# Patient Record
Sex: Female | Born: 1990 | Race: Black or African American | Hispanic: No | Marital: Single | Smoking: Former smoker
Health system: Southern US, Community
[De-identification: ages and names within clinical notes are randomized; demographics above are authoritative.]

## PROBLEM LIST (undated history)

## (undated) DIAGNOSIS — D259 Leiomyoma of uterus, unspecified: Secondary | ICD-10-CM

## (undated) DIAGNOSIS — G43909 Migraine, unspecified, not intractable, without status migrainosus: Secondary | ICD-10-CM

## (undated) DIAGNOSIS — D649 Anemia, unspecified: Secondary | ICD-10-CM

## (undated) DIAGNOSIS — J45909 Unspecified asthma, uncomplicated: Secondary | ICD-10-CM

## (undated) HISTORY — DX: Migraine, unspecified, not intractable, without status migrainosus: G43.909

## (undated) HISTORY — DX: Leiomyoma of uterus, unspecified: D25.9

## (undated) HISTORY — PX: TONSILECTOMY, ADENOIDECTOMY, BILATERAL MYRINGOTOMY AND TUBES: SHX2538

---

## 2013-10-24 ENCOUNTER — Emergency Department (INDEPENDENT_AMBULATORY_CARE_PROVIDER_SITE_OTHER)
Admission: EM | Admit: 2013-10-24 | Discharge: 2013-10-24 | Disposition: A | Discharge status: Home / Self Care | Source: Home / Self Care

## 2013-10-24 ENCOUNTER — Encounter (HOSPITAL_COMMUNITY): Payer: Self-pay | Admitting: Emergency Medicine

## 2013-10-24 ENCOUNTER — Other Ambulatory Visit (HOSPITAL_COMMUNITY)
Admission: RE | Admit: 2013-10-24 | Discharge: 2013-10-24 | Disposition: A | Discharge status: Home / Self Care | Source: Ambulatory Visit | Attending: Family Medicine | Admitting: Family Medicine

## 2013-10-24 DIAGNOSIS — L259 Unspecified contact dermatitis, unspecified cause: Secondary | ICD-10-CM

## 2013-10-24 DIAGNOSIS — N949 Unspecified condition associated with female genital organs and menstrual cycle: Secondary | ICD-10-CM

## 2013-10-24 DIAGNOSIS — N898 Other specified noninflammatory disorders of vagina: Secondary | ICD-10-CM

## 2013-10-24 DIAGNOSIS — R102 Pelvic and perineal pain: Secondary | ICD-10-CM

## 2013-10-24 DIAGNOSIS — Z113 Encounter for screening for infections with a predominantly sexual mode of transmission: Secondary | ICD-10-CM | POA: Insufficient documentation

## 2013-10-24 DIAGNOSIS — N760 Acute vaginitis: Secondary | ICD-10-CM | POA: Insufficient documentation

## 2013-10-24 DIAGNOSIS — N730 Acute parametritis and pelvic cellulitis: Secondary | ICD-10-CM

## 2013-10-24 HISTORY — DX: Unspecified asthma, uncomplicated: J45.909

## 2013-10-24 LAB — POCT URINALYSIS DIP (DEVICE)
Bilirubin Urine: NEGATIVE
GLUCOSE, UA: NEGATIVE mg/dL
Hgb urine dipstick: NEGATIVE
Ketones, ur: NEGATIVE mg/dL
LEUKOCYTES UA: NEGATIVE
Nitrite: NEGATIVE
Protein, ur: NEGATIVE mg/dL
Specific Gravity, Urine: >=1.03 (ref 1.005–1.030)
UROBILINOGEN UA: 1 mg/dL (ref 0.0–1.0)
pH: 5.5 (ref 5.0–8.0)

## 2013-10-24 LAB — POCT PREGNANCY, URINE: PREG TEST UR: NEGATIVE

## 2013-10-24 MED ORDER — METRONIDAZOLE 500 MG PO TABS: 500.0000 mg | ORAL_TABLET | Freq: Two times a day (BID) | ORAL | Status: DC

## 2013-10-24 MED ORDER — AZITHROMYCIN 250 MG PO TABS
1000.0000 mg | ORAL_TABLET | Freq: Every day | ORAL | Status: DC
  Administered 2013-10-24: 1000 mg via ORAL

## 2013-10-24 MED ORDER — AZITHROMYCIN 250 MG PO TABS
ORAL_TABLET | ORAL | Status: AC
  Filled 2013-10-24: qty 4

## 2013-10-24 MED ORDER — AZITHROMYCIN 250 MG PO TABS: 1000.0000 mg | ORAL_TABLET | Freq: Once | ORAL | Status: DC

## 2013-10-24 MED ORDER — LIDOCAINE HCL (PF) 1 % IJ SOLN
INTRAMUSCULAR | Status: AC
  Filled 2013-10-24: qty 5

## 2013-10-24 MED ORDER — CEFTRIAXONE SODIUM 250 MG IJ SOLR
INTRAMUSCULAR | Status: AC
  Filled 2013-10-24: qty 250

## 2013-10-24 MED ORDER — CEFTRIAXONE SODIUM 250 MG IJ SOLR
250.0000 mg | Freq: Once | INTRAMUSCULAR | Status: AC
  Administered 2013-10-24: 250 mg via INTRAMUSCULAR

## 2013-10-24 MED ORDER — TRIAMCINOLONE ACETONIDE 0.1 % EX CREA: TOPICAL_CREAM | CUTANEOUS | Status: DC

## 2013-10-24 NOTE — ED Notes (Addendum)
2 days of swelling and rash of right pinky finger.  Pt denies injury or exposure to allergens Also pt c/o  2 weeks of urinary frequency and dysuria

## 2013-10-24 NOTE — ED Provider Notes (Signed)
CSN: [id number]     Arrival date & time 10/24/13  1106 History   First MD Initiated Contact with Patient 10/24/13 1328     Chief Complaint  Patient presents with  . Rash   (Consider location/radiation/quality/duration/timing/severity/associated sxs/prior Treatment) HPI Comments: 23 year old female presents with a itchy rash to the right fifth digit. Occurred several days ago and is getting worse. She denies injury or pain. There are no other areas of the rash.  Second complaint is that of dysuria for 2 weeks. She feels a heaviness across the pelvis. After voiding she continues to feel the discomfort in the suprapubic area and pelvis.  Patient is a 23 y.o. female presenting with rash.  Rash   Past Medical History  Diagnosis Date  . Asthma    History reviewed. No pertinent past surgical history. No family history on file. History  Substance Use Topics  . Smoking status: Current Every Day Smoker    Types: Cigarettes  . Smokeless tobacco: Not on file  . Alcohol Use: No   OB History   Grav Para Term Preterm Abortions TAB SAB Ect Mult Living                 Review of Systems  Constitutional: Negative.   HENT: Negative.   Respiratory: Negative.   Gastrointestinal: Negative.   Genitourinary: Positive for dysuria, frequency and pelvic pain. Negative for vaginal discharge.  Musculoskeletal: Negative.   Skin: Positive for rash.       Papular rash to the right fifth digit. No erythema or signs of infection.    Allergies  Review of patient's allergies indicates no known allergies.  Home Medications   Current Outpatient Rx  Name  Route  Sig  Dispense  Refill  . metroNIDAZOLE (FLAGYL) 500 MG tablet   Oral   Take 1 tablet (500 mg total) by mouth 2 (two) times daily. X 7 days   14 tablet   0   . triamcinolone cream (KENALOG) 0.1 %      Apply to finger bid prn rash and itching.   15 g   0    BP 110/61  Pulse 59  Temp(Src) 98.5 F (36.9 C) (Oral)  Resp 16  SpO2 100%   LMP 09/26/2013 Physical Exam  Nursing note and vitals reviewed. Constitutional: She is oriented to person, place, and time. She appears well-developed and well-nourished. No distress.  Neck: Normal range of motion. Neck supple.  Cardiovascular: Normal rate, regular rhythm and normal heart sounds.   Pulmonary/Chest: Effort normal and breath sounds normal.  Abdominal: Soft. Bowel sounds are normal. She exhibits no distension.  Tenderness across the pelvis with greatest over the suprapubis.  Genitourinary:  Normal external female genitalia Small vaginal opening Moderate amount of thick white creamy discharge coating the proximal vaginal walls, vaginal vault and the cervix. Cervix is right of the midline and anterior. Os is closed. Cervix is pink without lesions. Bimanual positive CMT and right adnexal tenderness.  Neurological: She is alert and oriented to person, place, and time. She exhibits normal muscle tone.  Skin: Skin is warm and dry.  Psychiatric: She has a normal mood and affect.    ED Course  Procedures (including critical care time) Labs Review Labs Reviewed  POCT URINALYSIS DIP (DEVICE)  POCT PREGNANCY, URINE  CERVICOVAGINAL ANCILLARY ONLY   Imaging Review No results found. Results for orders placed during the hospital encounter of 10/24/13  POCT URINALYSIS DIP (DEVICE)      Result Value Range  Glucose, UA NEGATIVE  NEGATIVE mg/dL   Bilirubin Urine NEGATIVE  NEGATIVE   Ketones, ur NEGATIVE  NEGATIVE mg/dL   Specific Gravity, Urine >=1.030  1.005 - 1.030   Hgb urine dipstick NEGATIVE  NEGATIVE   pH 5.5  5.0 - 8.0   Protein, ur NEGATIVE  NEGATIVE mg/dL   Urobilinogen, UA 1.0  0.0 - 1.0 mg/dL   Nitrite NEGATIVE  NEGATIVE   Leukocytes, UA NEGATIVE  NEGATIVE  POCT PREGNANCY, URINE      Result Value Range   Preg Test, Ur NEGATIVE  NEGATIVE      MDM   1. Contact dermatitis   2. PID (acute pelvic inflammatory disease)   3. Vaginal discharge   4. Pelvic pain       Flagyl 500 mg twice a day for 7 days Rx Rocephin 250 mg IM and azithromycin 1 g by mouth Triamcinolone cream apply finger twice a day  Hayden Rasmussen, NP 10/24/13 1450

## 2013-10-24 NOTE — Discharge Instructions (Signed)
Contact Dermatitis °Contact dermatitis is a reaction to certain substances that touch the skin. Contact dermatitis can be either irritant contact dermatitis or allergic contact dermatitis. Irritant contact dermatitis does not require previous exposure to the substance for a reaction to occur. Allergic contact dermatitis only occurs if you have been exposed to the substance before. Upon a repeat exposure, your body reacts to the substance.  °CAUSES  °Many substances can cause contact dermatitis. Irritant dermatitis is most commonly caused by repeated exposure to mildly irritating substances, such as: °· Makeup. °· Soaps. °· Detergents. °· Bleaches. °· Acids. °· Metal salts, such as nickel. °Allergic contact dermatitis is most commonly caused by exposure to: °· Poisonous plants. °· Chemicals (deodorants, shampoos). °· Jewelry. °· Latex. °· Neomycin in triple antibiotic cream. °· Preservatives in products, including clothing. °SYMPTOMS  °The area of skin that is exposed may develop: °· Dryness or flaking. °· Redness. °· Cracks. °· Itching. °· Pain or a burning sensation. °· Blisters. °With allergic contact dermatitis, there may also be swelling in areas such as the eyelids, mouth, or genitals.  °DIAGNOSIS  °Your caregiver can usually tell what the problem is by doing a physical exam. In cases where the cause is uncertain and an allergic contact dermatitis is suspected, a patch skin test may be performed to help determine the cause of your dermatitis. °TREATMENT °Treatment includes protecting the skin from further contact with the irritating substance by avoiding that substance if possible. Barrier creams, powders, and gloves may be helpful. Your caregiver may also recommend: °· Steroid creams or ointments applied 2 times daily. For best results, soak the rash area in cool water for 20 minutes. Then apply the medicine. Cover the area with a plastic wrap. You can store the steroid cream in the refrigerator for a "chilly"  effect on your rash. That may decrease itching. Oral steroid medicines may be needed in more severe cases. °· Antibiotics or antibacterial ointments if a skin infection is present. °· Antihistamine lotion or an antihistamine taken by mouth to ease itching. °· Lubricants to keep moisture in your skin. °· Burow's solution to reduce redness and soreness or to dry a weeping rash. Mix one packet or tablet of solution in 2 cups cool water. Dip a clean washcloth in the mixture, wring it out a bit, and put it on the affected area. Leave the cloth in place for 30 minutes. Do this as often as possible throughout the day. °· Taking several cornstarch or baking soda baths daily if the area is too large to cover with a washcloth. °Harsh chemicals, such as alkalis or acids, can cause skin damage that is like a burn. You should flush your skin for 15 to 20 minutes with cold water after such an exposure. You should also seek immediate medical care after exposure. Bandages (dressings), antibiotics, and pain medicine may be needed for severely irritated skin.  °HOME CARE INSTRUCTIONS °· Avoid the substance that caused your reaction. °· Keep the area of skin that is affected away from hot water, soap, sunlight, chemicals, acidic substances, or anything else that would irritate your skin. °· Do not scratch the rash. Scratching may cause the rash to become infected. °· You may take cool baths to help stop the itching. °· Only take over-the-counter or prescription medicines as directed by your caregiver. °· See your caregiver for follow-up care as directed to make sure your skin is healing properly. °SEEK MEDICAL CARE IF:  °· Your condition is not better after 3   days of treatment.  You seem to be getting worse.  You see signs of infection such as swelling, tenderness, redness, soreness, or warmth in the affected area.  You have any problems related to your medicines. Document Released: 10/05/2000 Document Revised: 12/31/2011  Document Reviewed: 03/13/2011 [facility] Patient Information 2014 [place], [place].  Pelvic Inflammatory Disease Pelvic inflammatory disease (PID) is an infection in some or all of the female organs. PID can be in the uterus, ovaries, fallopian tubes, or the surrounding tissues inside the lower belly area (pelvis). HOME CARE   If given, take your antibiotic medicine as told. Finish them even if you start to feel better.  Only take medicine as told by your doctor.  Do not have sex (intercourse) until treatment is done or as told by your doctor.  Tell your sex partner if you have PID. Your partner may need to be treated.  Keep all doctor visits. GET HELP RIGHT AWAY IF:   You have a fever.  You have more belly (abdominal) or lower belly pain.  You have chills.  You have pain when you pee (urinate).  You are not better after 72 hours.  You have more fluid (discharge) coming from your vagina or fluid that is not normal.  You need pain medicine from your doctor.  You throw up (vomit).  You cannot take your medicines.  Your partner has a sexually transmitted disease (STD). MAKE SURE YOU:   Understand these instructions.  Will watch your condition.  Will get help right away if you are not doing well or get worse. Document Released: 01/04/2009 Document Revised: 02/02/2013 Document Reviewed: 10/04/2011 [facility] Patient Information 2014 [place]Care, [place].  Pelvic Pain Pelvic pain is pain felt below the belly button and between your hips. It can be caused by many different things. It is important to get help right away. This is especially true for severe, sharp, or unusual pain that comes on suddenly.  HOME CARE  Only take medicine as told by your doctor.  Rest as told by your doctor.  Eat a healthy diet, such as fruits, vegetables, and lean meats.  Drink enough fluids to keep your pee (urine) clear or pale yellow, or as told.  Avoid sex (intercourse) if it causes  pain.  Apply warm or cold packs to your lower belly (abdomen). Use the type of pack that helps the pain.  Avoid situations that cause you stress.  Keep a journal to track your pain. Write down:  When the pain started.  Where it is located.  If there are things that seem to be related to the pain, such as food or your period.  Follow up with your doctor as told. GET HELP RIGHT AWAY IF:   You have heavy bleeding from the vagina.  You have more pelvic pain.  You feel lightheaded or pass out (faint).  You have chills.  You have pain when you pee or have blood in your pee.  You cannot stop having watery poop (diarrhea).  You cannot stop throwing up (vomiting).  You have a fever or lasting symptoms for more than 3 days.  You have a fever and your symptoms suddenly get worse.  You are being physically or sexually abused.  Your medicine does not help your pain.  You have fluid (discharge) coming from your vagina that is not normal. MAKE SURE YOU:  Understand these instructions.  Will watch your condition.  Will get help if you are not doing well or get worse.  Document Released: 03/26/2008 Document Revised: 04/08/2012 Document Reviewed: 01/28/2012 [facility] Patient Information 2014 [place], [place].

## 2013-10-26 ENCOUNTER — Emergency Department (HOSPITAL_COMMUNITY)

## 2013-10-26 ENCOUNTER — Encounter (HOSPITAL_COMMUNITY): Payer: Self-pay | Admitting: Emergency Medicine

## 2013-10-26 ENCOUNTER — Emergency Department (HOSPITAL_COMMUNITY)
Admission: EM | Admit: 2013-10-26 | Discharge: 2013-10-26 | Disposition: A | Discharge status: Home / Self Care | Attending: Emergency Medicine | Admitting: Emergency Medicine

## 2013-10-26 DIAGNOSIS — R21 Rash and other nonspecific skin eruption: Secondary | ICD-10-CM | POA: Insufficient documentation

## 2013-10-26 DIAGNOSIS — F17200 Nicotine dependence, unspecified, uncomplicated: Secondary | ICD-10-CM | POA: Insufficient documentation

## 2013-10-26 DIAGNOSIS — J45909 Unspecified asthma, uncomplicated: Secondary | ICD-10-CM | POA: Insufficient documentation

## 2013-10-26 MED ORDER — HYDROCORTISONE 1 % EX CREA: TOPICAL_CREAM | CUTANEOUS | Status: DC

## 2013-10-26 NOTE — ED Notes (Signed)
Pt in c/o rash and redness to right pinky finger since Thursday, seen at urgent care for same yesterday but states symptoms are worse

## 2013-10-26 NOTE — Discharge Instructions (Signed)

## 2013-10-26 NOTE — ED Provider Notes (Signed)
Medical screening examination/treatment/procedure(s) were performed by non-physician practitioner and as supervising physician I was immediately available for consultation/collaboration.  EKG Interpretation   None         Richardean Canalavid H Noeh Sparacino, MD 10/26/13 [phone]

## 2013-10-26 NOTE — ED Provider Notes (Signed)
CSN: [id number]     Arrival date & time 10/26/13  0229 History   First MD Initiated Contact with Patient 10/26/13 [phone]     Chief Complaint  Patient presents with  . Rash   (Consider location/radiation/quality/duration/timing/severity/associated sxs/prior Treatment) HPI Comments: Patient presents emergency department with chief complaint of itchy and painful rash and irritation to the right small finger. She states that she first noticed the symptoms on Thursday. She was seen at urgent care yesterday, and was prescribed triamcinolone, which she has been using 3 times daily, with no relief. She denies fevers or chills. Denies rash anywhere else. She states the finger is painful to palpation and with movement.  Patient is a 23 y.o. female presenting with rash. The history is provided by the patient and a significant other. No language interpreter was used.  Rash   Past Medical History  Diagnosis Date  . Asthma    History reviewed. No pertinent past surgical history. History reviewed. No pertinent family history. History  Substance Use Topics  . Smoking status: Current Every Day Smoker    Types: Cigarettes  . Smokeless tobacco: Not on file  . Alcohol Use: No   OB History   Grav Para Term Preterm Abortions TAB SAB Ect Mult Living                 Review of Systems  Skin: Positive for rash.  All other systems reviewed and are negative.    Allergies  Review of patient's allergies indicates no known allergies.  Home Medications   Current Outpatient Rx  Name  Route  Sig  Dispense  Refill  . metroNIDAZOLE (FLAGYL) 500 MG tablet   Oral   Take 1 tablet (500 mg total) by mouth 2 (two) times daily. X 7 days   14 tablet   0   . triamcinolone cream (KENALOG) 0.1 %      Apply to finger bid prn rash and itching.   15 g   0    BP 118/60  Pulse 62  Temp(Src) 98.3 F (36.8 C) (Oral)  Resp 16  SpO2 99%  LMP 09/26/2013 Physical Exam  Nursing note and vitals  reviewed. Constitutional: She is oriented to person, place, and time. She appears well-developed and well-nourished.  HENT:  Head: Normocephalic and atraumatic.  Eyes: Conjunctivae and EOM are normal.  Neck: Normal range of motion.  Cardiovascular: Normal rate.   Pulmonary/Chest: Effort normal.  Abdominal: She exhibits no distension.  Musculoskeletal: Normal range of motion.  Neurological: She is alert and oriented to person, place, and time.  Skin: Skin is dry.  Maculopapular rash to the right small finger, no evidence of paronychia or felon  Psychiatric: She has a normal mood and affect. Her behavior is normal. Judgment and thought content normal.    ED Course  Procedures (including critical care time) Labs Review Labs Reviewed - No data to display Imaging Review No results found.  EKG Interpretation   None       MDM   1. Rash    Rash appears to be contact dermatitis.  No evidence of underlying or acute infections.  Will discharge to home with dermatology followup.  Recommend hydrocortisone cream.  Patient is stable and ready for discharge.    Roxy Horsemanobert Theresa Wedel, PA-C 10/26/13 [phone]

## 2013-10-27 NOTE — ED Notes (Signed)
GC/Chlamydia neg., Affirm: Candida and Trich neg., Gardnerella pos. Pt. adequately treated with Flagyl. Liat Mayol M 10/27/2013  

## 2013-10-27 NOTE — ED Provider Notes (Signed)
Medical screening examination/treatment/procedure(s) were performed by resident physician or non-physician practitioner and as supervising physician I was immediately available for consultation/collaboration.   Langston Summerfield DOUGLAS MD.   Lawerence Dery D Kathrynne Kulinski, MD 10/27/13 0843 

## 2013-11-10 ENCOUNTER — Encounter (HOSPITAL_COMMUNITY): Payer: Self-pay | Admitting: Emergency Medicine

## 2013-11-10 ENCOUNTER — Emergency Department (INDEPENDENT_AMBULATORY_CARE_PROVIDER_SITE_OTHER)
Admission: EM | Admit: 2013-11-10 | Discharge: 2013-11-10 | Disposition: A | Discharge status: Home / Self Care | Source: Home / Self Care | Attending: Family Medicine | Admitting: Family Medicine

## 2013-11-10 DIAGNOSIS — R102 Pelvic and perineal pain: Secondary | ICD-10-CM

## 2013-11-10 DIAGNOSIS — N949 Unspecified condition associated with female genital organs and menstrual cycle: Secondary | ICD-10-CM

## 2013-11-10 LAB — COMPREHENSIVE METABOLIC PANEL
ALK PHOS: 45 U/L (ref 39–117)
ALT: 16 U/L (ref 0–35)
AST: 21 U/L (ref 0–37)
Albumin: 3.7 g/dL (ref 3.5–5.2)
BILIRUBIN TOTAL: 0.3 mg/dL (ref 0.3–1.2)
BUN: 8 mg/dL (ref 6–23)
CHLORIDE: 107 meq/L (ref 96–112)
CO2: 25 meq/L (ref 19–32)
Calcium: 9 mg/dL (ref 8.4–10.5)
Creatinine, Ser: 0.85 mg/dL (ref 0.50–1.10)
GLUCOSE: 94 mg/dL (ref 70–99)
POTASSIUM: 4.4 meq/L (ref 3.7–5.3)
SODIUM: 142 meq/L (ref 137–147)
Total Protein: 7.1 g/dL (ref 6.0–8.3)

## 2013-11-10 LAB — CBC
HEMATOCRIT: 36.8 % (ref 36.0–46.0)
Hemoglobin: 12.7 g/dL (ref 12.0–15.0)
MCH: 31.3 pg (ref 26.0–34.0)
MCHC: 34.5 g/dL (ref 30.0–36.0)
MCV: 90.6 fL (ref 78.0–100.0)
PLATELETS: 204 10*3/uL (ref 150–400)
RBC: 4.06 MIL/uL (ref 3.87–5.11)
RDW: 13 % (ref 11.5–15.5)
WBC: 3.8 10*3/uL — ABNORMAL LOW (ref 4.0–10.5)

## 2013-11-10 LAB — POCT URINALYSIS DIP (DEVICE)
BILIRUBIN URINE: NEGATIVE
GLUCOSE, UA: NEGATIVE mg/dL
HGB URINE DIPSTICK: NEGATIVE
Ketones, ur: NEGATIVE mg/dL
Leukocytes, UA: NEGATIVE
Nitrite: NEGATIVE
PH: 7 (ref 5.0–8.0)
Protein, ur: NEGATIVE mg/dL
Specific Gravity, Urine: 1.025 (ref 1.005–1.030)
Urobilinogen, UA: 0.2 mg/dL (ref 0.0–1.0)

## 2013-11-10 LAB — POCT PREGNANCY, URINE: Preg Test, Ur: NEGATIVE

## 2013-11-10 LAB — LIPASE, BLOOD: LIPASE: 59 U/L (ref 11–59)

## 2013-11-10 NOTE — Discharge Instructions (Signed)
Thank you for coming in today. Take up to 2 Aleve twice daily for pain. You may also take this with Tylenol.  Make an appointment with an OB/GYN Dr. ASAP If you get dramatically worse go to [facility] hospital emergency room. If your belly pain worsens, or you have high fever, bad vomiting, blood in your stool or black tarry stool go to the Emergency Room.  I will call you if your lab tests are very abnormal. Try taking MiraLAX daily to have a soft bowel movement daily.  [facility] [address] [place], [place] [phone]  [facility] [address] [place], [place] [phone]  [facility]: Silverio Lay MD [address] [place], [place] [phone]  [facility] [address] [place], [place] [phone]  [facility]: Marcelle Overlie MD [address] [place], [place] [phone]  [facility]: Bing Plume MD [address] [place], [place] [phone]  [facility] [address] [place], [place] [phone]   [facility]: [address] [place], [place] [address] [phone]   Abdominal Pain, Women Abdominal (stomach, pelvic, or belly) pain can be caused by many things. It is important to tell your doctor:  The location of the pain.  Does it come and go or is it present all the time?  Are there things that start the pain (eating certain foods, exercise)?  Are there other symptoms associated with the pain (fever, nausea, vomiting, diarrhea)? All of this is helpful to know when trying to find the cause of the pain. CAUSES   Stomach: virus or bacteria infection, or ulcer.  Intestine: appendicitis (inflamed appendix), regional ileitis (Crohn's disease), ulcerative colitis (inflamed colon), irritable bowel syndrome, diverticulitis (inflamed diverticulum of the colon), or  cancer of the stomach or intestine.  Gallbladder disease or stones in the gallbladder.  Kidney disease, kidney stones, or infection.  Pancreas infection or cancer.  Fibromyalgia (pain disorder).  Diseases of the female organs:  Uterus: fibroid (non-cancerous) tumors or infection.  Fallopian tubes: infection or tubal pregnancy.  Ovary: cysts or tumors.  Pelvic adhesions (scar tissue).  Endometriosis (uterus lining tissue growing in the pelvis and on the pelvic organs).  Pelvic congestion syndrome (female organs filling up with blood just before the menstrual period).  Pain with the menstrual period.  Pain with ovulation (producing an egg).  Pain with an IUD (intrauterine device, birth control) in the uterus.  Cancer of the female organs.  Functional pain (pain not caused by a disease, may improve without treatment).  Psychological pain.  Depression. DIAGNOSIS  Your doctor will decide the seriousness of your pain by doing an examination.  Blood tests.  X-rays.  Ultrasound.  CT scan (computed tomography, special type of X-ray).  MRI (magnetic resonance imaging).  Cultures, for infection.  Barium enema (dye inserted in the large intestine, to better view it with X-rays).  Colonoscopy (looking in intestine with a lighted tube).  Laparoscopy (minor surgery, looking in abdomen with a lighted tube).  Major abdominal exploratory surgery (looking in abdomen with a large incision). TREATMENT  The treatment will depend on the cause of the pain.   Many cases can be observed and treated at home.  Over-the-counter medicines recommended by your caregiver.  Prescription medicine.  Antibiotics, for infection.  Birth control pills, for painful periods or for ovulation pain.  Hormone treatment, for endometriosis.  Nerve  blocking injections.  Physical therapy.  Antidepressants.  Counseling with a psychologist or psychiatrist.  Minor or major surgery. HOME  CARE INSTRUCTIONS   Do not take laxatives, unless directed by your caregiver.  Take over-the-counter pain medicine only if ordered by your caregiver. Do not take aspirin because it can cause an upset stomach or bleeding.  Try a clear liquid diet (broth or water) as ordered by your caregiver. Slowly move to a bland diet, as tolerated, if the pain is related to the stomach or intestine.  Have a thermometer and take your temperature several times a day, and record it.  Bed rest and sleep, if it helps the pain.  Avoid sexual intercourse, if it causes pain.  Avoid stressful situations.  Keep your follow-up appointments and tests, as your caregiver orders.  If the pain does not go away with medicine or surgery, you may try:  Acupuncture.  Relaxation exercises (yoga, meditation).  Group therapy.  Counseling. SEEK MEDICAL CARE IF:   You notice certain foods cause stomach pain.  Your home care treatment is not helping your pain.  You need stronger pain medicine.  You want your IUD removed.  You feel faint or lightheaded.  You develop nausea and vomiting.  You develop a rash.  You are having side effects or an allergy to your medicine. SEEK IMMEDIATE MEDICAL CARE IF:   Your pain does not go away or gets worse.  You have a fever.  Your pain is felt only in portions of the abdomen. The right side could possibly be appendicitis. The left lower portion of the abdomen could be colitis or diverticulitis.  You are passing blood in your stools (bright red or black tarry stools, with or without vomiting).  You have blood in your urine.  You develop chills, with or without a fever.  You pass out. MAKE SURE YOU:   Understand these instructions.  Will watch your condition.  Will get help right away if you are not doing well or get worse. Document Released: 08/05/2007 Document Revised: 12/31/2011 Document Reviewed: 08/25/2009 [facility] Patient Information 2014 [place],  [place].

## 2013-11-10 NOTE — ED Notes (Signed)
C/o  Lower abdominal pain x 1 month.  Was recently treated for PID.  Pt did finish all prescribed meds.   Also c/o pressure with urinating,frequency and back pain.  Denies fever, n/v/d.   Having normal daily BM.

## 2013-11-10 NOTE — ED Provider Notes (Addendum)
Lindsey Duran is a 23 y.o. female who presents to Urgent Care today for lower abdominal pain. This is been present for about one month. Patient denies any injury or current vaginal discharge. She was seen at [facility] urgent care on January 3 and was thought to have possibly pelvic inflammatory disease. She was treated with ceftriaxone azithromycin and metronidazole. Her cytology returned positive only for Gardnerella. The gonorrhea Chlamydia and Trichomonas were negative. She's completed the metronidazole course which helped vaginal discharge but not her pain. She notes the pain is a constant and worse with prolonged standing. She does note urinary frequency, urgency, and dysuria. She denies any nausea vomiting or diarrhea. She notes that she has bowel movements twice daily. No fevers or chills or blood in her stool.    Past Medical History  Diagnosis Date  . Asthma    History  Substance Use Topics  . Smoking status: Current Every Day Smoker    Types: Cigarettes  . Smokeless tobacco: Not on file  . Alcohol Use: Yes   ROS as above Medications: No current facility-administered medications for this encounter.   Current Outpatient Prescriptions  Medication Sig Dispense Refill  . hydrocortisone cream 1 % Apply to affected area 2 times daily  15 g  0    Exam:  BP 107/56  Pulse 63  Temp(Src) 97.9 F (36.6 C) (Oral)  Resp 20  SpO2 100%  LMP 10/27/2013 Gen: Well NAD HEENT: EOMI,  MMM Lungs: Normal work of breathing. CTABL Heart: RRR no MRG Abd: NABS, Soft. Nondistended. Mildly tender lower abdomen no rebound or guarding. No CV angle tenderness to percussion Exts: Brisk capillary refill, warm and well perfused.   Results for orders placed during the hospital encounter of 11/10/13 (from the past 24 hour(s))  POCT URINALYSIS DIP (DEVICE)     Status: None   Collection Time    11/10/13  9:55 AM      Result Value Range   Glucose, UA NEGATIVE  NEGATIVE mg/dL   Bilirubin Urine NEGATIVE   NEGATIVE   Ketones, ur NEGATIVE  NEGATIVE mg/dL   Specific Gravity, Urine 1.025  1.005 - 1.030   Hgb urine dipstick NEGATIVE  NEGATIVE   pH 7.0  5.0 - 8.0   Protein, ur NEGATIVE  NEGATIVE mg/dL   Urobilinogen, UA 0.2  0.0 - 1.0 mg/dL   Nitrite NEGATIVE  NEGATIVE   Leukocytes, UA NEGATIVE  NEGATIVE  POCT PREGNANCY, URINE     Status: None   Collection Time    11/10/13  9:56 AM      Result Value Range   Preg Test, Ur NEGATIVE  NEGATIVE  COMPREHENSIVE METABOLIC PANEL     Status: None   Collection Time    11/10/13 10:38 AM      Result Value Range   Sodium 142  137 - 147 mEq/L   Potassium 4.4  3.7 - 5.3 mEq/L   Chloride 107  96 - 112 mEq/L   CO2 25  19 - 32 mEq/L   Glucose, Bld 94  70 - 99 mg/dL   BUN 8  6 - 23 mg/dL   Creatinine, Ser 1.61  0.50 - 1.10 mg/dL   Calcium 9.0  8.4 - 09.6 mg/dL   Total Protein 7.1  6.0 - 8.3 g/dL   Albumin 3.7  3.5 - 5.2 g/dL   AST 21  0 - 37 U/L   ALT 16  0 - 35 U/L   Alkaline Phosphatase 45  39 -  117 U/L   Total Bilirubin 0.3  0.3 - 1.2 mg/dL   GFR calc non Af Amer >90  >90 mL/min   GFR calc Af Amer >90  >90 mL/min  CBC     Status: Abnormal   Collection Time    11/10/13 10:38 AM      Result Value Range   WBC 3.8 (*) 4.0 - 10.5 K/uL   RBC 4.06  3.87 - 5.11 MIL/uL   Hemoglobin 12.7  12.0 - 15.0 g/dL   HCT 16.136.8  09.636.0 - 04.546.0 %   MCV 90.6  78.0 - 100.0 fL   MCH 31.3  26.0 - 34.0 pg   MCHC 34.5  30.0 - 36.0 g/dL   RDW 40.913.0  81.111.5 - 91.415.5 %   Platelets 204  150 - 400 K/uL  LIPASE, BLOOD     Status: None   Collection Time    11/10/13 10:38 AM      Result Value Range   Lipase 59  11 - 59 U/L   No results found.  Assessment and Plan: 23 y.o. female with one month of lower abdominal or pelvic pain. Unclear etiology at this point. Urinalysis is normal-appearing. Urine culture is pending. Additionally I have obtained a CMP, CBC, and lipase and recommend patient use MiraLAX daily.  Have also referred her to OB/GYN  Discussed warning signs or  symptoms. Please see discharge instructions. Patient expresses understanding.    Rodolph BongEvan S Donny Heffern, MD 11/10/13 1037   Addendum:  Labs within normal limits. Updated above. Follow with OB/GYN  Rodolph BongEvan S Jakobe Blau, MD 11/10/13 1300

## 2013-11-11 LAB — URINE CULTURE
Colony Count: 10000
Special Requests: NORMAL

## 2013-11-23 ENCOUNTER — Other Ambulatory Visit (HOSPITAL_COMMUNITY): Payer: Self-pay | Admitting: Obstetrics and Gynecology

## 2013-11-23 DIAGNOSIS — R102 Pelvic and perineal pain: Secondary | ICD-10-CM

## 2013-11-25 ENCOUNTER — Encounter (HOSPITAL_COMMUNITY): Payer: Self-pay

## 2013-11-25 ENCOUNTER — Ambulatory Visit (HOSPITAL_COMMUNITY)
Admission: RE | Admit: 2013-11-25 | Discharge: 2013-11-25 | Disposition: A | Discharge status: Home / Self Care | Source: Ambulatory Visit | Attending: Obstetrics and Gynecology | Admitting: Obstetrics and Gynecology

## 2013-11-25 DIAGNOSIS — N949 Unspecified condition associated with female genital organs and menstrual cycle: Secondary | ICD-10-CM | POA: Insufficient documentation

## 2013-11-25 DIAGNOSIS — R102 Pelvic and perineal pain: Secondary | ICD-10-CM

## 2013-11-25 DIAGNOSIS — N83209 Unspecified ovarian cyst, unspecified side: Secondary | ICD-10-CM | POA: Insufficient documentation

## 2013-11-25 MED ORDER — IOHEXOL 300 MG/ML  SOLN
100.0000 mL | Freq: Once | INTRAMUSCULAR | Status: AC | PRN
  Administered 2013-11-25: 100 mL via INTRAVENOUS

## 2016-05-15 ENCOUNTER — Encounter (HOSPITAL_COMMUNITY): Payer: Self-pay | Admitting: Emergency Medicine

## 2016-05-15 ENCOUNTER — Ambulatory Visit (HOSPITAL_COMMUNITY)
Admission: EM | Admit: 2016-05-15 | Discharge: 2016-05-15 | Disposition: A | Discharge status: Home / Self Care | Attending: Internal Medicine | Admitting: Internal Medicine

## 2016-05-15 DIAGNOSIS — L309 Dermatitis, unspecified: Secondary | ICD-10-CM

## 2016-05-15 MED ORDER — PREDNISONE 50 MG PO TABS: 50.0000 mg | ORAL_TABLET | Freq: Every day | ORAL | 0 refills | Status: DC

## 2016-05-15 MED ORDER — HYDROCORTISONE VALERATE 0.2 % EX OINT: 1.0000 "application " | TOPICAL_OINTMENT | Freq: Two times a day (BID) | CUTANEOUS | 0 refills | Status: DC

## 2016-05-15 NOTE — Discharge Instructions (Signed)
Seek further evaluation if not starting to improve in several days, or if increasing redness/pain/crusting occur in rashy spots.

## 2016-05-15 NOTE — ED Provider Notes (Signed)
[facility]    CSN: [id number] Arrival date & time: 05/15/16  1026  First Provider Contact:  None       History   Chief Complaint Chief Complaint  Patient presents with  . Rash    HPI Lindsey Duran is a 25 y.o. female. She presents today with a three-week history of itchy, tiny bumps/blisters on her feet and hands primarily. Feels fine otherwise, no fever, no malaise. Wonders about flea bites, but has not spent time with a pet or in a pets environment.  HPI  Past Medical History:  Diagnosis Date  . Asthma     There are no active problems to display for this patient.   History reviewed. No pertinent surgical history.     Home Medications    Prior to Admission medications   Medication Sig Start Date End Date Taking? Authorizing Provider  hydrocortisone cream 1 % Apply to affected area 2 times daily 10/26/13   Roxy Horseman, PA-C    Family History History reviewed. No pertinent family history.  Social History Social History  Substance Use Topics  . Smoking status: Current Every Day Smoker    Packs/day: 0.25    Years: 5.00    Types: Cigarettes  . Smokeless tobacco: Never Used  . Alcohol use Yes     Allergies   Review of patient's allergies indicates no known allergies.   Review of Systems Review of Systems  All other systems reviewed and are negative.    Physical Exam Triage Vital Signs ED Triage Vitals  Enc Vitals Group     BP 05/15/16 1133 106/66     Pulse Rate 05/15/16 1133 71     Resp 05/15/16 1133 16     Temp 05/15/16 1133 98.5 F (36.9 C)     Temp Source 05/15/16 1133 Oral     SpO2 05/15/16 1133 100 %     Pain Score 05/15/16 1141 4    Updated Vital Signs BP 106/66 (BP Location: Right Arm)   Pulse 71   Temp 98.5 F (36.9 C) (Oral)   Resp 16   LMP 04/27/2016 (Exact Date)   SpO2 100%  Physical Exam  Constitutional: She appears well-developed and well-nourished. No distress.  HENT:  Head: Normocephalic and  atraumatic.  Eyes: Conjunctivae are normal.  Neck: Neck supple.  Cardiovascular: Normal rate.   No murmur heard. Pulmonary/Chest: Effort normal. No respiratory distress.  Abdominal: Soft. She exhibits no distension.  Musculoskeletal: She exhibits no edema.  Neurological: She is alert.  Skin: Skin is warm and dry.     See diagram. Tiny deep vesicle/pustules, resolving to hyperpigmented spots.  Psychiatric: She has a normal mood and affect.  Nursing note and vitals reviewed.    UC Treatments / Results  Labs (all labs ordered are listed, but only abnormal results are displayed) Labs Reviewed - No data to display  EKG  EKG Interpretation None       Radiology No results found.  Procedures Procedures (including critical care time) none  Medications Ordered in UC Medications - No data to display     Final Clinical Impressions(s) / UC Diagnoses   Final diagnoses:  Eczema    New Prescriptions Discharge Medication List as of 05/15/2016  1:05 PM    START taking these medications   Details  hydrocortisone valerate ointment (WESTCORT) 0.2 % Apply 1 application topically 2 (two) times daily., Starting Tue 05/15/2016, Normal    predniSONE (DELTASONE) 50 MG tablet Take 1 tablet (50  mg total) by mouth daily., Starting Tue 05/15/2016, Normal         Eustace Moore, MD 05/24/16 1300

## 2016-05-15 NOTE — ED Triage Notes (Signed)
The patient presented to the [facility] with a complaint of a rash on her legs, arms and torso x 3 weeks.

## 2016-05-31 ENCOUNTER — Encounter (HOSPITAL_COMMUNITY): Payer: Self-pay | Admitting: Emergency Medicine

## 2016-05-31 ENCOUNTER — Ambulatory Visit (HOSPITAL_COMMUNITY)
Admission: EM | Admit: 2016-05-31 | Discharge: 2016-05-31 | Disposition: A | Discharge status: Home / Self Care | Attending: Family Medicine | Admitting: Family Medicine

## 2016-05-31 DIAGNOSIS — B86 Scabies: Secondary | ICD-10-CM

## 2016-05-31 DIAGNOSIS — R21 Rash and other nonspecific skin eruption: Secondary | ICD-10-CM

## 2016-05-31 MED ORDER — PERMETHRIN 5 % EX CREA: TOPICAL_CREAM | CUTANEOUS | 1 refills | Status: DC

## 2016-05-31 MED ORDER — HYDROXYZINE HCL 25 MG PO TABS: 25.0000 mg | ORAL_TABLET | Freq: Four times a day (QID) | ORAL | 0 refills | Status: DC

## 2016-05-31 NOTE — ED Provider Notes (Signed)
CSN: [id number]     Arrival date & time 05/31/16  1608 History   None    Chief Complaint  Patient presents with  . Rash   (Consider location/radiation/quality/duration/timing/severity/associated sxs/prior Treatment) Patient states she is still having rash on her feet and buttocks.  She states the rash has moved onto her fingers and she is itching a lot.  Her sleep partner is starting to get this rash on her feet.   The history is provided by the patient.  Rash  Location:  Full body Quality: blistering, itchiness and redness   Severity:  Moderate Duration:  1 week Timing:  Constant Progression:  Spreading Chronicity:  New Relieved by:  Nothing Worsened by:  Nothing Ineffective treatments:  Anti-itch cream and topical steroids   Past Medical History:  Diagnosis Date  . Asthma    History reviewed. No pertinent surgical history. History reviewed. No pertinent family history. Social History  Substance Use Topics  . Smoking status: Current Every Day Smoker    Packs/day: 0.25    Years: 5.00    Types: Cigarettes  . Smokeless tobacco: Never Used  . Alcohol use Yes   OB History    No data available     Review of Systems  Constitutional: Negative.   HENT: Negative.   Eyes: Negative.   Respiratory: Negative.   Cardiovascular: Negative.   Gastrointestinal: Negative.   Endocrine: Negative.   Genitourinary: Negative.   Musculoskeletal: Negative.   Skin: Positive for rash.  Neurological: Negative.   Hematological: Negative.   Psychiatric/Behavioral: Negative.     Allergies  Review of patient's allergies indicates no known allergies.  Home Medications   Prior to Admission medications   Medication Sig Start Date End Date Taking? Authorizing Provider  hydrocortisone valerate ointment (WESTCORT) 0.2 % Apply 1 application topically 2 (two) times daily. 05/15/16  Yes Eustace Moore, MD  predniSONE (DELTASONE) 50 MG tablet Take 1 tablet (50 mg total) by mouth daily.  05/15/16  Yes Eustace Moore, MD  hydrocortisone cream 1 % Apply to affected area 2 times daily 10/26/13   Roxy Horseman, PA-C  hydrOXYzine (ATARAX/VISTARIL) 25 MG tablet Take 1 tablet (25 mg total) by mouth every 6 (six) hours. 05/31/16   Deatra Canter, FNP  permethrin (ELIMITE) 5 % cream Apply to affected area once 05/31/16   Deatra Canter, FNP   Meds Ordered and Administered this Visit  Medications - No data to display  BP 112/71 (BP Location: Right Arm)   Pulse 60   Temp 98.6 F (37 C) (Oral)   Resp 12   LMP 04/27/2016 (Exact Date)   SpO2 100%  No data found.   Physical Exam  Constitutional: She appears well-developed and well-nourished.  HENT:  Head: Normocephalic and atraumatic.  Eyes: Conjunctivae and EOM are normal. Pupils are equal, round, and reactive to light.  Neck: Normal range of motion. Neck supple.  Cardiovascular: Normal rate, regular rhythm and normal heart sounds.   Pulmonary/Chest: Effort normal and breath sounds normal.  Skin:  Feet with papules and few blister type rash, bilateral hands with burroughs side of finger bilateral index fingers.  Nursing note and vitals reviewed.   Urgent Care Course   Clinical Course    Procedures (including critical care time)  Labs Review Labs Reviewed - No data to display  Imaging Review No results found.   Visual Acuity Review  Right Eye Distance:   Left Eye Distance:   Bilateral Distance:    Right  Eye Near:   Left Eye Near:    Bilateral Near:         MDM   1. Scabies   2. Rash    Permetherin cream apply head to toe and repeat in a week #60 grams Hydroxyzine 25mg  one po q 6 hours prn #21      Deatra CanterWilliam J Estefanie Cornforth, FNP 05/31/16 [id number]

## 2016-05-31 NOTE — ED Triage Notes (Signed)
C/o persistent rash since July.... Seen here on 7/25 for similar sx... Given prednisone and hydrocortisone ointment w/no relief.... A&O x4... NAD

## 2018-11-26 ENCOUNTER — Telehealth (HOSPITAL_COMMUNITY): Payer: Self-pay

## 2018-11-26 ENCOUNTER — Other Ambulatory Visit (HOSPITAL_COMMUNITY): Payer: Self-pay | Admitting: *Deleted

## 2018-11-26 DIAGNOSIS — N6310 Unspecified lump in the right breast, unspecified quadrant: Secondary | ICD-10-CM

## 2018-11-26 NOTE — Telephone Encounter (Signed)
Left message with patient letting her know I was returning her voicemail that she left [place]. Left number for her to call back.

## 2019-01-08 ENCOUNTER — Ambulatory Visit
Admission: RE | Admit: 2019-01-08 | Discharge: 2019-01-08 | Disposition: A | Discharge status: Home / Self Care | Payer: No Typology Code available for payment source | Source: Ambulatory Visit | Attending: Obstetrics and Gynecology | Admitting: Obstetrics and Gynecology

## 2019-01-08 ENCOUNTER — Other Ambulatory Visit (HOSPITAL_COMMUNITY): Payer: Self-pay | Admitting: Obstetrics and Gynecology

## 2019-01-08 ENCOUNTER — Other Ambulatory Visit: Payer: Self-pay

## 2019-01-08 ENCOUNTER — Encounter (HOSPITAL_COMMUNITY): Payer: Self-pay

## 2019-01-08 ENCOUNTER — Ambulatory Visit (HOSPITAL_COMMUNITY)
Admission: RE | Admit: 2019-01-08 | Discharge: 2019-01-08 | Disposition: A | Discharge status: Home / Self Care | Source: Ambulatory Visit | Attending: Obstetrics and Gynecology | Admitting: Obstetrics and Gynecology

## 2019-01-08 VITALS — BP 118/76 | Temp 97.7°F | Wt 143.0 lb

## 2019-01-08 DIAGNOSIS — N6315 Unspecified lump in the right breast, overlapping quadrants: Secondary | ICD-10-CM

## 2019-01-08 DIAGNOSIS — N6310 Unspecified lump in the right breast, unspecified quadrant: Secondary | ICD-10-CM

## 2019-01-08 DIAGNOSIS — Z1239 Encounter for other screening for malignant neoplasm of breast: Secondary | ICD-10-CM

## 2019-01-08 HISTORY — DX: Anemia, unspecified: D64.9

## 2019-01-08 NOTE — Progress Notes (Signed)
Complaints of a right breast lump for 9 years.  Pap Smear: Pap smear not completed today. Last Pap smear was in November 2019 at the [facility] and normal per patient. Per patient has no history of an abnormal Pap smear. No Pap smear results are in Epic.  Physical exam: Breasts Breasts symmetrical. No skin abnormalities bilateral breasts. No nipple retraction bilateral breasts. No nipple discharge bilateral breasts. No lymphadenopathy. No lumps palpated left breast. Palpated a mobile lump within the right breast at 3 o'clock under the areola next to the nipple. No complaints of pain or tenderness on exam. Referred patient to the [facility] for a right breast ultrasound. Appointment scheduled for Thursday, January 08, 2019 at 1010.        Pelvic/Bimanual No Pap smear completed today since last Pap smear was in November 2019 per patient. Pap smear not indicated per BCCCP guidelines.   Smoking History: Patient is a current smoker. Discussed smoking cessation with patient. Referred to the [facility] Quitline and gave resources to the free smoking cessation classes at [facility].  Patient Navigation: Patient education provided. Access to services provided for patient through BCCCP program.   Breast and Cervical Cancer Risk Assessment: Patient has no family history of breast cancer, known genetic mutations, or radiation treatment to the chest before age 75. Patient has no history of cervical dysplasia, immunocompromised, or DES exposure in-utero. Breast cancer risk assessment completed. No breast cancer risk calculated due to patient is less than 47 years old.

## 2019-01-08 NOTE — Patient Instructions (Signed)
Explained breast self awareness with [employer]. Patient did not need a Pap smear today due to last Pap smear was in November 2019 per patient. Let her know BCCCP will cover Pap smears every 3 years unless has a history of abnormal Pap smears. Referred patient to the [facility] for a right breast ultrasound. Appointment scheduled for Thursday, January 08, 2019 at 1010. Patient aware of appointment and will be there. Discussed smoking cessation with patient. Referred to the [facility] Quitline and gave resources to the free smoking cessation classes at [facility]. Shauntell Faith Gumina verbalized understanding.  Judaea Burgoon, Kathaleen Maser, RN 10:34 AM

## 2019-02-04 ENCOUNTER — Encounter (HOSPITAL_COMMUNITY): Payer: Self-pay | Admitting: *Deleted

## 2019-07-14 ENCOUNTER — Inpatient Hospital Stay: Admission: RE | Admit: 2019-07-14 | Payer: No Typology Code available for payment source | Source: Ambulatory Visit

## 2019-08-03 ENCOUNTER — Other Ambulatory Visit: Payer: Self-pay

## 2019-08-03 ENCOUNTER — Other Ambulatory Visit (HOSPITAL_COMMUNITY): Payer: Self-pay | Admitting: Obstetrics and Gynecology

## 2019-08-03 ENCOUNTER — Ambulatory Visit
Admission: RE | Admit: 2019-08-03 | Discharge: 2019-08-03 | Disposition: A | Discharge status: Home / Self Care | Payer: No Typology Code available for payment source | Source: Ambulatory Visit | Attending: Obstetrics and Gynecology | Admitting: Obstetrics and Gynecology

## 2019-08-03 DIAGNOSIS — N6310 Unspecified lump in the right breast, unspecified quadrant: Secondary | ICD-10-CM

## 2019-11-11 ENCOUNTER — Other Ambulatory Visit: Payer: Self-pay

## 2019-11-11 ENCOUNTER — Ambulatory Visit: Payer: No Typology Code available for payment source | Admitting: Family Medicine

## 2019-12-11 ENCOUNTER — Other Ambulatory Visit: Payer: Self-pay

## 2019-12-11 ENCOUNTER — Emergency Department (HOSPITAL_COMMUNITY)
Admission: EM | Admit: 2019-12-11 | Discharge: 2019-12-11 | Disposition: A | Discharge status: Home / Self Care | Payer: No Typology Code available for payment source | Attending: Emergency Medicine | Admitting: Emergency Medicine

## 2019-12-11 ENCOUNTER — Encounter (HOSPITAL_COMMUNITY): Payer: Self-pay

## 2019-12-11 DIAGNOSIS — Y939 Activity, unspecified: Secondary | ICD-10-CM | POA: Diagnosis not present

## 2019-12-11 DIAGNOSIS — R519 Headache, unspecified: Secondary | ICD-10-CM | POA: Diagnosis present

## 2019-12-11 DIAGNOSIS — J45909 Unspecified asthma, uncomplicated: Secondary | ICD-10-CM | POA: Diagnosis not present

## 2019-12-11 DIAGNOSIS — F17210 Nicotine dependence, cigarettes, uncomplicated: Secondary | ICD-10-CM | POA: Insufficient documentation

## 2019-12-11 DIAGNOSIS — Y999 Unspecified external cause status: Secondary | ICD-10-CM | POA: Insufficient documentation

## 2019-12-11 DIAGNOSIS — W228XXA Striking against or struck by other objects, initial encounter: Secondary | ICD-10-CM | POA: Diagnosis not present

## 2019-12-11 DIAGNOSIS — Y9289 Other specified places as the place of occurrence of the external cause: Secondary | ICD-10-CM | POA: Insufficient documentation

## 2019-12-11 DIAGNOSIS — G44309 Post-traumatic headache, unspecified, not intractable: Secondary | ICD-10-CM | POA: Insufficient documentation

## 2019-12-11 DIAGNOSIS — F0781 Postconcussional syndrome: Secondary | ICD-10-CM | POA: Insufficient documentation

## 2019-12-11 NOTE — Discharge Instructions (Signed)
You may alternate taking Tylenol and Ibuprofen as needed for pain control. You may take 400-600 mg of ibuprofen every 6 hours and [phone] mg of Tylenol every 6 hours. Do not exceed 4000 mg of Tylenol daily as this can lead to liver damage. Also, make sure to take Ibuprofen with meals as it can cause an upset stomach. Do not take other NSAIDs while taking Ibuprofen such as (Aleve, Naprosyn, Aspirin, Celebrex, etc) and do not take more than the prescribed dose as this can lead to ulcers and bleeding in your GI tract. You may use warm and cold compresses to help with your symptoms.   HEAD INJURY If any of the following occur notify your physician or go to the Hospital Emergency Department:  Increased drowsiness, stupor or loss of consciousness  Restlessness or convulsions (fits)  Paralysis in arms or legs  Temperature above 100 F  Vomiting  Severe headache  Blood or clear fluid dripping from the nose or ears  Stiffness of the neck  Dizziness or blurred vision  Pulsating pain in the eye  Unequal pupils of eye  Personality changes  Any other unusual symptoms PRECAUTIONS  Do not take tranquilizers, sedatives, narcotics or alcohol  Avoid aspirin. Use only acetaminophen (e.g. Tylenol) or ibuprofen (e.g. Advil) for relief of pain. Follow directions on the bottle for dosage.  Use ice packs for comfort  Getting plenty of rest and sleep helps the brain to heal. Do not try to do too much too fast. As you start to feel better, you can slowly and gradually return to your usual routine.  Avoid activities that are physically demanding (e.g., sports, heavy housecleaning, exercising) or require a lot of thinking or concentration (e.g., working on the computer, playing video games).  Ask your health care professional when you can safely drive a car, ride a bike, or operate heavy equipment. MEDICATIONS Use medications only as directed by your physician  Follow up with the concussion clinic within 5-7 days  for re-evaluation.
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
## 2019-12-14 ENCOUNTER — Ambulatory Visit: Payer: No Typology Code available for payment source | Admitting: Family Medicine

## 2019-12-14 ENCOUNTER — Other Ambulatory Visit: Payer: Self-pay

## 2019-12-14 ENCOUNTER — Telehealth: Payer: Self-pay | Admitting: Family Medicine

## 2019-12-14 VITALS — BP 110/74 | HR 77 | Ht 68.0 in | Wt 152.0 lb

## 2019-12-14 DIAGNOSIS — S060X0A Concussion without loss of consciousness, initial encounter: Secondary | ICD-10-CM

## 2019-12-14 MED ORDER — ONDANSETRON 4 MG PO TBDP: 4.0000 mg | ORAL_TABLET | Freq: Three times a day (TID) | ORAL | 0 refills | Status: DC | PRN

## 2019-12-14 MED ORDER — MECLIZINE HCL 25 MG PO TABS: 25.0000 mg | ORAL_TABLET | Freq: Three times a day (TID) | ORAL | 3 refills | Status: DC | PRN
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
## 2019-12-14 NOTE — Patient Instructions (Signed)
Thank you for coming in today. Try the zofran and meclezine for dizzy or motion sickness.  Ok to take tylenol or ibupforen for pain.  Tylenol 1000mg  every 6 hours or ibuprofen 600-800mg  every 8 hours.  Recheck with me in about 1 weeks.

## 2019-12-14 NOTE — Progress Notes (Signed)
Subjective:   I, Lindsey Duran, am serving as a scribe for Dr. Clementeen Graham, MD.   Chief Complaint: Lindsey Duran, DOB: 1991-01-16, is a 29 y.o. female who presents for head injury sustained after a piece of ice/snow slid off a roof and landed on her head on right side of head. LOC (-). History of head injury (-). Patient states that she has been having a 10/10 headache since injury. Also having photophobia, nausea, dizziness. Saw doctor in ED in [place]. CT scan (-). Is also having constant, right shoulder achiness as ice hit the shoulder as well.    Injury date : 12/04/2019 Visit #: 1  Previous imagine.   History of Present Illness:    Concussion Self-Reported Symptom Score Symptoms rated on a scale 1-6, in last 24 hours  Headache:6   Nausea: 6  Vomiting: 0  Balance Difficulty: 0  Dizziness: 6  Fatigue: 6  Trouble Falling Asleep: 6   Sleep More Than Usual: 0  Sleep Less Than Usual: 6  Daytime Drowsiness: 6  Photophobia: 6  Phonophobia: 6  Irritability: 6  Sadness:6  Nervousness: 6  Feeling More Emotional: 6  Numbness or Tingling: 6  Feeling Slowed Down: 6  Feeling Mentally Foggy: 6  Difficulty Concentrating: 6  Difficulty Remembering: 6  Visual Problems: 6    Total Symptom Score: 113   Review of Systems:  No , visual changes, nausea, vomiting, diarrhea, constipation, dizziness, abdominal pain, skin rash, fevers, chills, night sweats, weight loss, swollen lymph nodes, body aches, joint swelling, muscle aches, chest pain, shortness of breath, mood changes.   +Headache   Review of History: Past Medical History: @PMHP @  Past Surgical History:  has no past surgical history on file. Family History: family history includes Hypertension in her paternal grandmother. no family history of autoimmune Social History:  reports that she has been smoking cigarettes. She has a 1.25 pack-year smoking history. She has never used smokeless tobacco. She reports previous alcohol  use. She reports current drug use. Frequency: 1.00 time per week. Drug: Marijuana. Current Medications: has a current medication list which includes the following prescription(s): meclizine and ondansetron. Allergies: has No Known Allergies.  Objective:    Physical Examination Vitals:   12/14/19 1426  BP: 110/74  Pulse: 77  SpO2: 98%   General: No apparent distress alert and oriented x3 mood and affect normal, dressed appropriately.  HEENT: Pupils equal, extraocular movements intact  Respiratory: Patient's speak in full sentences and does not appear short of breath  Cardiovascular: No lower extremity edema, non tender, no erythema  Skin: Warm dry intact with no signs of infection or rash on extremities or on axial skeleton.  Abdomen: Soft nontender  Neuro: Cranial nerves II through XII are intact, neurovascularly intact in all extremities with 2+ DTRs and 2+ pulses.  Lymph: No lymphadenopathy of posterior or anterior cervical chain or axillae bilaterally.  Gait normal with good balance and coordination.  MSK:  Non tender with full range of motion and good stability and symmetric strength and tone of shoulders, elbows, wrist,  knee and ankles bilaterally.  Psychiatric: Oriented X3, intact recent and remote memory, judgement and insight, normal mood and affect  Concussion testing performed today:  I spent 20 minutes with patient discussing test and results including review of history and patient chart and  integration of patient data, interpretation of standardized test results and clinical data, clinical decision making, treatment planning and report,and interactive feedback to the patient with all of patients  questions answered.    Neurocognitive testing (ImPACT):   Post #1:    Verbal Memory Composite  34 (<1%)   Visual Memory Composite  35 (<1%)   Visual Motor Speed Composite  16.30 (<1%)   Reaction Time Composite  1.17 (<1%)   Cognitive Efficiency Index  0    Vestibular  Screening:       Headache  Dizziness  Smooth Pursuits n n  H. Saccades y n  V. Saccades y n  H. VOR y n  V. VOR y n  Economist y y      Convergence: 0 cm  n n   Balance Screen: Normal double leg and single leg impaired tandem     Assessment:    Concussion without loss of consciousness, initial encounter  Lindsey Duran presents with the following concussion subtypes. [x] Cognitive [] Cervical [x] Vestibular [x] Ocular [] Migraine [x] Anxiety/Mood    Patient is very symptomatic in multiple domains including cognitive headache vestibular and mood.  Her concussion is just about 9 weeks old now.  Fortunately she has already had some neuroimaging including a reportedly normal CT scan of her head in [place].  Plan to emphasize cognitive rest and recheck in about a week.  Will use meclizine and Zofran as needed for some dizziness episodes.  If in about a week or 2 she is not feeling any better would start treating her symptoms symptomatically including potentially medications for her anxiety mood complaints and consider vestibular therapy for her vestibular complaints if needed.  Spent a great deal of time reassuring her that her symptoms should get better.  Discussed work as well.  Total encounter time 45 minutes including charting time date of service.  After Visit Summary printed out and provided to patient as appropriate.  The above documentation has been reviewed and is accurate and complete [name]

## 2019-12-14 NOTE — Telephone Encounter (Signed)
Patient called stating she was referred to [place] for a recent concussion from the [facility] ED. Can you contact patient to schedule?

## 2019-12-16 ENCOUNTER — Other Ambulatory Visit: Payer: Self-pay

## 2019-12-17 ENCOUNTER — Encounter: Payer: Self-pay | Admitting: Family Medicine

## 2019-12-17 ENCOUNTER — Ambulatory Visit (INDEPENDENT_AMBULATORY_CARE_PROVIDER_SITE_OTHER): Payer: No Typology Code available for payment source | Admitting: Family Medicine

## 2019-12-17 VITALS — BP 98/68 | HR 82 | Temp 97.9°F | Ht 68.0 in | Wt 156.0 lb

## 2019-12-17 DIAGNOSIS — L819 Disorder of pigmentation, unspecified: Secondary | ICD-10-CM

## 2019-12-17 DIAGNOSIS — F0781 Postconcussional syndrome: Secondary | ICD-10-CM | POA: Diagnosis not present

## 2019-12-17 DIAGNOSIS — Z7689 Persons encountering health services in other specified circumstances: Secondary | ICD-10-CM

## 2019-12-17 DIAGNOSIS — Z8709 Personal history of other diseases of the respiratory system: Secondary | ICD-10-CM | POA: Diagnosis not present

## 2019-12-17 NOTE — Progress Notes (Signed)
Patient presents to clinic today to f/u on ongoing issues and establish care.  SUBJECTIVE: PMH: Pt is a 29 yo female with pmh sig for asthma.  Pt has not had a regular provider in a while.  Concussion: -pt was hit in the head by a large piece of ice that slid off a roof while on vacation in [place] on 12/04/19. -denies LOC -Pt seen in ED 2/14.  CT head negative.  dx'd with concussion. -endorses HAs, photophobia, dizziness -seen by Sports Med 2/22 -taking meclizine and zofran prn -feels somewhat frustrated as unable to go to work   H/o asthma: -no recent symptoms -symptoms in middle/high school  Skin lesion: -pt notes hyperpigemented  Allergies: NKDA  Past surgical history: Tonsillectomy in eighth grade.  Social hx: Pt is a Writer of [employer].  She has a master's degree.  She is from [place], [place].  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  Pt endorses occasional tobacco use and social alcohol use.  Pt denies drug use.  Health Maintenance: Dental --[employer] PAP -- a few yrs ago while in college  Family medical history: Mom-alive, miscarriage Dad-Alive, drug abuse Brother-Justin, alive MGM-Alive, arthritis, HLD PGM-alive, HLD  Past Medical History:  Diagnosis Date  . Anemia   . Asthma   . Migraines     Past Surgical History:  Procedure Laterality Date  . TONSILECTOMY, ADENOIDECTOMY, BILATERAL MYRINGOTOMY AND TUBES      Current Outpatient Medications on File Prior to Visit  Medication Sig Dispense Refill  . meclizine (ANTIVERT) 25 MG tablet Take 1 tablet (25 mg total) by mouth 3 (three) times daily as needed for dizziness or nausea. 30 tablet 3  . ondansetron (ZOFRAN ODT) 4 MG disintegrating tablet Take 1 tablet (4 mg total) by mouth every 8 (eight) hours as needed for nausea or vomiting. 20 tablet 0   No current facility-administered medications on file prior to visit.    No Known Allergies  Family  History  Problem Relation Age of Onset  . Hypertension Paternal Grandmother     Social History   Socioeconomic History  . Marital status: Single    Spouse name: Not on file  . Number of children: 0  . Years of education: Not on file  . Highest education level: Master's degree (e.g., MA, MS, MEng, MEd, MSW, MBA)  Occupational History  . Not on file  Tobacco Use  . Smoking status: Current Every Day Smoker    Packs/day: 0.25    Years: 5.00    Pack years: 1.25    Types: Cigarettes  . Smokeless tobacco: Never Used  Substance and Sexual Activity  . Alcohol use: Not Currently  . Drug use: Yes    Frequency: 1.0 times per week    Types: Marijuana  . Sexual activity: Yes    Birth control/protection: None  Other Topics Concern  . Not on file  Social History Narrative  . Not on file   Social Determinants of Health   Financial Resource Strain:   . Difficulty of Paying Living Expenses: Not on file  Food Insecurity:   . Worried About Charity fundraiser in the Last Year: Not on file  . Ran Out of Food in the Last Year: Not on file  Transportation Needs: No Transportation Needs  . Lack of Transportation (Medical): No  . Lack of Transportation (Non-Medical): No  Physical Activity:   . Days of Exercise per Week: Not  on file  . Minutes of Exercise per Session: Not on file  Stress:   . Feeling of Stress : Not on file  Social Connections:   . Frequency of Communication with Friends and Family: Not on file  . Frequency of Social Gatherings with Friends and Family: Not on file  . Attends Religious Services: Not on file  . Active Member of Clubs or Organizations: Not on file  . Attends Banker Meetings: Not on file  . Marital Status: Not on file  Intimate Partner Violence:   . Fear of Current or Ex-Partner: Not on file  . Emotionally Abused: Not on file  . Physically Abused: Not on file  . Sexually Abused: Not on file    ROS General: Denies fever, chills, night  sweats, changes in weight, changes in appetite HEENT: Denies ear pain, changes in vision, rhinorrhea, sore throat  +HAs, dizziness, photosensitivity CV: Denies CP, palpitations, SOB, orthopnea Pulm: Denies SOB, cough, wheezing GI: Denies abdominal pain, nausea, vomiting, diarrhea, constipation GU: Denies dysuria, hematuria, frequency, vaginal discharge Msk: Denies muscle cramps, joint pains Neuro: Denies weakness, numbness, tingling Skin: Denies rashes, bruising Psych: Denies depression, anxiety, hallucinations  BP 98/68 (BP Location: Left Arm, Patient Position: Sitting, Cuff Size: Normal)   Pulse 82   Temp 97.9 F (36.6 C) (Temporal)   Ht 5\' 8"  (1.727 m)   Wt 156 lb (70.8 kg)   SpO2 98%   BMI 23.72 kg/m   Physical Exam Gen. Pleasant, well developed, well-nourished, in NAD HEENT - [place]/AT, face symmetric, PERRL, EOMI, no nystagmus, no scleral icterus, no nasal drainage, TMs normal b/l Lungs: no use of accessory muscles, CTAB, no wheezes, rales or rhonchi Cardiovascular: RRR, No r/g/m, no peripheral edema Musculoskeletal: No deformities, moves all four extremities, no cyanosis or clubbing, normal tone Neuro:  A&Ox3, CN II-XII intact, normal gait, sensitive to light, no nystagmus, normal strength in UEs and LEs b/l. Skin:  Warm, dry, intact.  Faint area of hyperpigmentation on L shin without TTP.  No results found for this or any previous visit (from the past 2160 hour(s)).  Assessment/Plan: Post concussion syndrome -continue mental rest and supportive care -continue f/u with Sports Med -given handout -given precautions  History of asthma -no recent issues -continue to monitor  Discoloration of skin -Stable/resolving -Discussed obtaining labs at next [facility] -Consider dermatology referral.  Encounter to establish care -We reviewed the PMH, PSH, FH, SH, Meds and Allergies. -We provided refills for any medications we will prescribe as needed. -We addressed current concerns  per orders and patient instructions. -We have asked for records for pertinent exams, studies, vaccines and notes from previous providers. -We have advised patient to follow up per instructions below.   F/u prn  [facility], MD

## 2019-12-17 NOTE — Patient Instructions (Signed)
Post-Concussion Syndrome    A concussion is a brain injury from a direct hit (blow) to your head or body. This blow causes your brain to shake quickly back and forth inside your skull. This can damage brain cells and cause chemical changes in your brain. Concussions are usually not life-threatening but can cause several serious symptoms.  Post-concussion syndrome is when symptoms that occur after a concussion last longer than normal. These symptoms can last from weeks to months.  What are the causes?  The cause of this condition is not known. It can happen whether your head injury was mild or severe.  What increases the risk?  You are more likely to develop this condition if:  · You are female.  · You are a child, teen, or young adult.  · You had a past head injury.  · You have a history of headaches.  · You have depression or anxiety.  What are the signs or symptoms?  Physical symptoms  · Headaches.  · Tiredness.  · Dizziness.  · Weakness.  · Blurry vision.  · Sensitivity to light.  · Hearing difficulties.  Mental and emotional symptoms  · Memory difficulties.  · Difficulty with concentration.  · Difficulty sleeping or staying asleep.  · Feeling irritable.  · Anxiety or depression.  · Difficulty learning new things.  How is this diagnosed?  This condition may be diagnosed based on:  · Your symptoms.  · A description of your injury.  · Your medical history.  Your health care provider may order other tests such as:  · Brain function tests (neurological testing).  · CT scan.  How is this treated?  Treatment for this condition may depend on your symptoms. Symptoms usually go away on their own over time. Treatments may include:  · Medicines for headaches.  · Resting your brain and body for a few days after your injury.  · Rehabilitation therapy, such as:  ? Physical or occupational therapy. This may include exercises to help with balance and dizziness.  ? Mental health counseling.  ? Speech therapy.  ? Vision therapy. A  brain and eye specialist can recommend treatments for vision problems.  Follow these instructions at home:  Medicines  · Take over-the-counter and prescription medicines only as told by your health care provider.  · Avoid opioid prescription pain medicines when recovering from a concussion.  Activity  · Limit your mental activities for the first few days after your injury, such as:  ? Homework or job-related work.  ? Complex thinking.  ? Watching TV, and using a computer or phone.  ? Playing memory games and puzzles.  ? Gradually return to your normal activity level. If a certain activity brings on your symptoms, stop or slow down until you can do the activity without it triggering your symptoms.  · Limit physical activity, such as exercise or sports, for the first few days after a concussion. Gradually return to normal activity as told by your health care provider.  ? If a certain activity brings on your symptoms, stop or slow down until you can do the activity without it triggering your symptoms.  · Rest. Rest helps your brain heal. Make sure you:  ? Get plenty of sleep at night. Most adults should get at least 7-9 hours of sleep each night.  ? Rest during the day. Take naps or rest breaks when you feel tired.  · Do not do high-risk activities that could cause a second concussion,   by your health care provider. This is important. Contact a health care provider if:  Your symptoms do not improve.  You have another injury. Get help right away if you:  Have a severe or worsening headache.  Are confused.  Have trouble staying awake.  Pass  out.  Vomit.  Have weakness or numbness in any part of your body.  Have a seizure.  Have trouble speaking. Summary  Post-concussion syndrome is when symptoms that occur after a concussion last longer than normal.  Symptoms usually go away on their own over time. Depending on your symptoms, you may need treatment, such as medicines or rehabilitation therapy.  Rest your brain and body for a few days after your injury. Gradually return to activities, as told by your health care provider.  Get plenty of sleep, and avoid alcohol and opioid pain medicines while recovering from a concussion. This information is not intended to replace advice given to you by your health care provider. Make sure you discuss any questions you have with your health care provider. Document Revised: 07/31/2018 Document Reviewed: 11/12/2017 Elsevier Patient Education  2020 [employer].

## 2019-12-21 ENCOUNTER — Encounter: Payer: Self-pay | Admitting: Family Medicine

## 2019-12-21 ENCOUNTER — Other Ambulatory Visit: Payer: Self-pay

## 2019-12-21 ENCOUNTER — Ambulatory Visit (INDEPENDENT_AMBULATORY_CARE_PROVIDER_SITE_OTHER): Payer: No Typology Code available for payment source | Admitting: Family Medicine

## 2019-12-21 VITALS — BP 100/72 | HR 94 | Ht 68.0 in | Wt 154.6 lb

## 2019-12-21 DIAGNOSIS — S060X0A Concussion without loss of consciousness, initial encounter: Secondary | ICD-10-CM

## 2019-12-21 MED ORDER — TRAZODONE HCL 50 MG PO TABS: 50.0000 mg | ORAL_TABLET | Freq: Every day | ORAL | 2 refills | Status: DC

## 2019-12-21 NOTE — Patient Instructions (Addendum)
Thank you for coming in today. Plan to start trazodone at bedtime for sleep.  If not significantly better in 1 week I recommend that we start cymbalta or prozac.  Send me an update.  Ok to start doing work like tasks with frequent breaks as tolerated.  Lets plan on recheck in 2 weeks.   Plan for vestibular PT.

## 2019-12-21 NOTE — Progress Notes (Signed)
Subjective:   @VITALSMCOMMENTS @  Chief Complaint: I, Lindsey Duran, LAT, ATC, am serving as scribe for Dr. [name].  Lindsey Duran, Lindsey Duran: May 23, 1991, is a 29 y.o. female who presents for f/u of a concussion sustained on 12/04/19 when a piece of snow/ice slid off a roof and hit her on the head.  She was last seen by Dr. Georgina Snell on 12/14/19 and was c/o HA, photophobia, nausea and dizziness.  Since her last visit, pt reports that she con't to have photophobia, difficulty focusing, irritability and mood swings.  She also con't to suffer from migraines but notes that they aren't as intense as before.  She states that she is no longer having any ringing in her ears.  She reports trying to use the computer earlier today for approximately 30 min which caused eye strain and increased irritability.  Chief Complaint  Patient presents with  . Follow-up    concussion    Injury date : 12/04/19 Visit #: 2  Previous imagine.   History of Present Illness:    Concussion Self-Reported Symptom Score Symptoms rated on a scale 1-6, in last 24 hours  Headache: 4    Nausea: 4  Vomiting: 0  Balance Difficulty: 0   Dizziness: 4  Fatigue: 6  Trouble Falling Asleep: 6   Sleep More Than Usual: 0  Sleep Less Than Usual: 4  Daytime Drowsiness: 4  Photophobia: 6  Phonophobia: 5  Irritability: 6  Sadness: 6  Nervousness: 2  Feeling More Emotional: 6  Numbness or Tingling: 0  Feeling Slowed Down: 4  Feeling Mentally Foggy: 5  Difficulty Concentrating: 6  Difficulty Remembering: 5  Visual Problems: 6  Neck Pain: 0  Tinnitus: 0   Total Number of Symptoms: 18/22 Total Symptom Score: 93/132 Previous Symptom Score: 113/132  Review of Systems:  No , visual changes, nausea, vomiting, diarrhea, constipation, dizziness, abdominal pain, skin rash, fevers, chills, night sweats, weight loss, swollen lymph nodes, body aches, joint swelling, muscle aches, chest pain, shortness of breath, mood changes.    +Headache   Review of History: Past Medical History: @PMHP @  Past Surgical History:  has a past surgical history that includes Tonsilectomy, adenoidectomy, bilateral myringotomy and tubes. Family History: family history includes Hypertension in her paternal grandmother. no family history of autoimmune Social History:  reports that she has been smoking cigarettes. She has a 1.25 pack-year smoking history. She has never used smokeless tobacco. She reports previous alcohol use. She reports current drug use. Frequency: 1.00 time per week. Drug: Marijuana. Current Medications: has a current medication list which includes the following prescription(s): meclizine, ondansetron, and trazodone. Allergies: has No Known Allergies.  Objective:    Physical Examination Vitals:   12/21/19 1400  BP: 100/72  Pulse: 94  SpO2: 97%   .  General: Alert and oriented normal speech thought process and affect. HEENT: Photophobia.  Patient has have closed eyes or squinting during exam. Neuro: Alert and oriented.  Normal and coordination and gait. Psych: Alert and oriented speech and thought process are normal.   Assessment:    Concussion without loss of consciousness, initial encounter - Plan: Ambulatory referral to Physical Therapy  Lindsey Duran presents with the following concussion subtypes. [x] Cognitive [] Cervical [x] Vestibular [] Ocular [] Migraine [] Anxiety/Mood   Plan:     Concussion: Aniayah still is quite symptomatic.  She is slightly improved from last visit.  She is having problems in multiple domains including insomnia mood vertigo and photophobia.  Discussed graduated return to work strategies.  Will refer to vestibular physical therapy.  Additionally will prescribe trazodone for insomnia.  Check back in about 2 weeks.  If not improving in 1 week would consider adding either Prozac or Cymbalta for mood symptoms.  Orders Placed This Encounter  Procedures  . Ambulatory referral  to Physical Therapy    Referral Priority:   Routine    Referral Type:   Physical Medicine    Referral Reason:   Specialty Services Required    Requested Specialty:   Physical Therapy   The above documentation has been reviewed and is accurate and complete Clementeen Graham   Cautions reviewed.  Patient expresses understanding and agreement.  Total encounter time 30 minutes including charting time date of service.

## 2019-12-29 ENCOUNTER — Other Ambulatory Visit: Payer: Self-pay

## 2019-12-29 ENCOUNTER — Emergency Department (HOSPITAL_COMMUNITY)
Admission: EM | Admit: 2019-12-29 | Discharge: 2019-12-29 | Discharge status: Absent without leave | Payer: No Typology Code available for payment source

## 2020-01-04 ENCOUNTER — Encounter: Payer: Self-pay | Admitting: Physical Therapy

## 2020-01-04 ENCOUNTER — Other Ambulatory Visit: Payer: Self-pay

## 2020-01-04 ENCOUNTER — Ambulatory Visit: Payer: No Typology Code available for payment source | Attending: Family Medicine | Admitting: Physical Therapy

## 2020-01-04 ENCOUNTER — Encounter: Payer: Self-pay | Admitting: Family Medicine

## 2020-01-04 DIAGNOSIS — R42 Dizziness and giddiness: Secondary | ICD-10-CM | POA: Diagnosis not present

## 2020-01-04 DIAGNOSIS — R2681 Unsteadiness on feet: Secondary | ICD-10-CM

## 2020-01-04 DIAGNOSIS — R2689 Other abnormalities of gait and mobility: Secondary | ICD-10-CM

## 2020-01-04 DIAGNOSIS — M25511 Pain in right shoulder: Secondary | ICD-10-CM

## 2020-01-04 NOTE — Therapy (Signed)
[facility] [address] [place], [place], [address] Phone: [phone]   Fax:  [phone]  Physical Therapy Evaluation  Patient Details  Name: Lindsey Duran MRN: [id number] Date of Birth: 1991-04-24 Referring Provider (PT): Clementeen Graham, MD   Encounter Date: 01/04/2020  PT End of Session - 01/04/20 1941    Visit Number  1    Number of Visits  9    Date for PT Re-Evaluation  02/19/20   extended target date due to delay in initiating treatment due to no appt availability   Authorization Type  [employer]ette Butters Rule    PT Start Time  [phone]    PT Stop Time  1018    PT Time Calculation (min)  45 min    Activity Tolerance  Patient tolerated treatment well    Behavior During Therapy  [facility] for tasks assessed/performed       Past Medical History:  Diagnosis Date  . Anemia   . Asthma   . Migraines     Past Surgical History:  Procedure Laterality Date  . TONSILECTOMY, ADENOIDECTOMY, BILATERAL MYRINGOTOMY AND TUBES      There were no vitals filed for this visit.   Subjective Assessment - 01/04/20 0944    Subjective  Pt states she was hit in head by boulder of ice [place] a ski resort in [place], [place] on 12-04-19; now has intermittent dizziness, migraines, and Rt shoulder pain intermittently    Pertinent History  asthma, h/o migraines (pt denies migraines prior to accident)    Patient Stated Goals  "get my right side of my body congruent with my left side and feel confident again"    Currently in Pain?  Yes    Pain Score  6     Pain Location  Shoulder    Pain Orientation  Right    Pain Descriptors / Indicators  Sharp   at times burning and shooting pain   Pain Type  Acute pain    Pain Radiating Towards  radiates to her back    Pain Onset  1 to 4 weeks ago    Pain Frequency  Intermittent    Aggravating Factors   no specific - spontaneously occurs    Pain Relieving Factors  Tylenol         [facility] PT Assessment -  01/04/20 0951      Assessment   Medical Diagnosis  Post concussion syndrome    Referring Provider (PT)  Clementeen Graham, MD    Onset Date/Surgical Date  12/04/19    Hand Dominance  Right      Precautions   Precautions  None      Restrictions   Weight Bearing Restrictions  No      Balance Screen   Has the patient fallen in the past 6 months  No    Has the patient had a decrease in activity level because of a fear of falling?   No    Is the patient reluctant to leave their home because of a fear of falling?   No      Prior Function   Level of Independence  Independent    Vocation  Full time employment    Vocation Requirements  mental health therapist     Leisure  travelling, reading, beach, loves nature       Observation/Other Assessments   Focus on Therapeutic Outcomes (FOTO)   FOTO was not set up prior to eval by front office  ROM / Strength   AROM / PROM / Strength  Strength      Strength   Overall Strength  Deficits    Overall Strength Comments  c/o pain with resistance    Strength Assessment Site  Shoulder;Hip;Knee;Ankle    Right/Left Shoulder  Right    Right Shoulder Flexion  3+/5    Right Shoulder Extension  3+/5    Right Shoulder ABduction  4/5    Right/Left Hip  Right    Right Hip Extension  3+/5    Right Hip ABduction  3+/5    Right/Left Knee  Right    Right Knee Flexion  4/5    Right Knee Extension  4/5   c/o knee pain with resistance   Right/Left Ankle  Right    Right Ankle Dorsiflexion  5/5           Vestibular Assessment - 01/04/20 0001      Vestibular Assessment   General Observation  pt is a 29 yr old female amb. with cautious gait pattern       Symptom Behavior   Subjective history of current problem  pt is a 29 yr old female with post concussion syndrome after a boulder of ice fell on her head at a ski resort in [place], [place]; pt now states she has had dizziness, migraines, intermittent Rt shoulder pain with shooting pains and c/o that  her Rt leg is not as fluid in movement as her left leg    Type of Dizziness   Unsteady with head/body turns;Lightheadedness    Frequency of Dizziness  varies     Duration of Dizziness  varies - depends on the movement or activity    Symptom Nature  Motion provoked;Constant    Aggravating Factors  Activity in general;Turning head quickly;Walking in a crowd;Sitting in moving car;Forward bending    Relieving Factors  Lying supine;Closing eyes;Head stationary;Avoiding busy/distracting areas    Progression of Symptoms  Better    History of similar episodes  none      Oculomotor Exam   Oculomotor Alignment  Normal    Spontaneous  Absent    Head shaking Horizontal  Absent   4/10 intensity with head turns 5x    Head Shaking Vertical  Absent   0/10 intensity with head turns 5 reps   Smooth Pursuits  Intact   c/o dizziness with testing   Saccades  Intact      Oculomotor Exam-Fixation Suppressed    Ocular Alignment  normal      Visual Acuity   Static  line 9    Dynamic  line 8    c/o dizziness          Objective measurements completed on examination: See above findings.              PT Education - 01/04/20 1940    Education Details  x1 viewing exercise - in standing    Person(s) Educated  Patient    Methods  Explanation;Demonstration;Handout    Comprehension  Verbalized understanding;Returned demonstration          PT Long Term Goals - 01/04/20 1954      PT LONG TERM GOAL #1   Title  Pt will demo improved VOR function by testing WNL's for DVA with no c/o dizziness upon completion of test.    Baseline  1 line difference with c/o dizziness    Time  4    Period  Weeks    Status  New    Target Date  02/19/20      PT LONG TERM GOAL #2   Title  Pt will amb. 76' with horizontal head turns with dizziness rating </= 1/10 for incr. ease with environmental scanning.    Baseline  4/10 intensity    Time  4    Period  Weeks    Status  New    Target Date  02/19/20       PT LONG TERM GOAL #3   Title  Complete SOT and establish LTG as appropriate.    Time  6    Period  Weeks    Status  New    Target Date  02/19/20      PT LONG TERM GOAL #4   Title  Pt will subjectively report at least 75% improvement in dizziness, Rt shoulder pain, and RLE mobility/flexibility.    Time  4    Period  Weeks    Status  New    Target Date  02/19/20      PT LONG TERM GOAL #5   Title  Independent in HEP for balance, vestibular and aquatic exercises.    Time  4    Period  Weeks    Status  New    Target Date  02/19/20             Plan - 01/04/20 1943    Clinical Impression Statement  Pt is a 29 yr old lady with post-concussion syndrome with c/o dizziness, light-headedness, migraines, and intermittent Rt shoulder pain and also c/o decreased mobility and coordination in RLE compared to that in her LLE.  Pt presents with increased symptoms with horizontal head turns with intensity 4/10 comparted to vertical head turns in sitting with intensity 0/10.  DVA was WNL's with a 1 line difference, however, symptoms were provoked with testing.  Pt is unable to amb. in straight path with horizontal head turns and she reports increased dizziness with amb. with head turns.  Pt will benefit from PT to address balance and vestibular deficits as well as Rt shoulder pain and decreased AROM and coordination RLE (pt reports Rt leg feels tight compared to her Lt leg).    Personal Factors and Comorbidities  Comorbidity 1;Time since onset of injury/illness/exacerbation;Fitness;Age    Comorbidities  asthma, migraines    Examination-Activity Limitations  Locomotion Level;Bed Mobility;Squat;Stand;Reach Overhead;Bend    Examination-Participation Restrictions  Cleaning;Meal Prep;Community Activity;Driving;Yard Work;Interpersonal Relationship;Laundry;Other   work   Stability/Clinical Decision Making  Stable/Uncomplicated    Clinical Decision Making  Low    Rehab Potential  Good    PT Frequency   2x / week    PT Duration  4 weeks    PT Treatment/Interventions  ADLs/Self Care Home Management;Balance training;Vestibular;Neuromuscular re-education;Patient/family education;Aquatic Therapy;Therapeutic activities;Gait training;Therapeutic exercise    PT Next Visit Plan  do SOT; check x1 viewing exercise given for HEP    PT Home Exercise Plan  x1 viewing    Recommended Other Services  aquatic therapy    Consulted and Agree with Plan of Care  Patient       Patient will benefit from skilled therapeutic intervention in order to improve the following deficits and impairments:  Dizziness, Decreased balance, Decreased activity tolerance, Pain, Decreased strength, Difficulty walking, Impaired UE functional use  Visit Diagnosis: Dizziness and giddiness - Plan: PT plan of care cert/re-cert  Acute pain of right shoulder - Plan: PT plan of care cert/re-cert  Unsteadiness on feet - Plan: PT plan of  care cert/re-cert  Other abnormalities of gait and mobility - Plan: PT plan of care cert/re-cert     Problem List Patient Active Problem List   Diagnosis Date Noted  . Post concussion syndrome 12/17/2019  . History of asthma 12/17/2019    Kary Kos, PT 01/04/2020, 8:17 PM  [facility] [address] [place], [place], [address] Phone: [phone]   Fax:  [phone]  Name: Sophiana Milanese MRN: [id number] Date of Birth: [birth year]

## 2020-01-04 NOTE — Patient Instructions (Signed)
Gaze Stabilization: Tip Card  1.Target must remain in focus, not blurry, and appear stationary while head is in motion. 2.Perform exercises with small head movements (45 to either side of midline). 3.Increase speed of head motion so long as target is in focus. 4.If you wear eyeglasses, be sure you can see target through lens (therapist will give specific instructions for bifocal / progressive lenses). 5.These exercises may provoke dizziness or nausea. Work through these symptoms. If too dizzy, slow head movement slightly. Rest between each exercise. 6.Exercises demand concentration; avoid distractions. 7.For safety, perform standing exercises close to a counter, wall, corner, or next to someone.     Gaze Stabilization: Standing Feet Apart    Feet shoulder width apart, keeping eyes on target on wall _6___ feet away, tilt head down 15-30 and move head side to side for _60___ seconds. Repeat while moving head up and down for _60___ seconds. Do __3-5__ sessions per day. Repeat using target on pattern background.  Copyright  VHI. All rights reserved.   

## 2020-01-11 ENCOUNTER — Ambulatory Visit: Payer: No Typology Code available for payment source | Admitting: Physical Therapy

## 2020-01-11 ENCOUNTER — Other Ambulatory Visit: Payer: Self-pay

## 2020-01-11 ENCOUNTER — Encounter: Payer: Self-pay | Admitting: Physical Therapy

## 2020-01-11 DIAGNOSIS — R42 Dizziness and giddiness: Secondary | ICD-10-CM | POA: Diagnosis not present

## 2020-01-11 DIAGNOSIS — R2681 Unsteadiness on feet: Secondary | ICD-10-CM

## 2020-01-12 NOTE — Patient Instructions (Signed)
Sheet with 4 positions - Balance on Foam - EO and EC -- Feet apart and feet together

## 2020-01-12 NOTE — Therapy (Signed)
[place] [address] [place], [place], [address] Phone: [phone]   Fax:  [phone]  Physical Therapy Treatment  Patient Details  Name: Lindsey Duran MRN: [id number] Date of Birth: [birth year] Referring Provider (PT): [name], MD   Encounter Date: 01/11/2020  PT End of Session - 01/12/20 2140    Visit Number  2    Number of Visits  9    Date for PT Re-Evaluation  02/19/20   extended target date due to delay in initiating treatment due to no appt availability   Authorization Type  [employer] [place] Gemma Rule    PT Start Time  1502    PT Stop Time  1546    PT Time Calculation (min)  44 min    Activity Tolerance  Patient tolerated treatment well    Behavior During Therapy  [facility] for tasks assessed/performed       Past Medical History:  Diagnosis Date  . Anemia   . Asthma   . Migraines     Past Surgical History:  Procedure Laterality Date  . TONSILECTOMY, ADENOIDECTOMY, BILATERAL MYRINGOTOMY AND TUBES      There were no vitals filed for this visit.  Subjective Assessment - 01/11/20 1510    Subjective  Pt states she has done the gaze stabilization exercise twice since initial eval last week; up to about 20 secs horizontal; has not done the vertical motion yet with this exercise   pt states she is a little dizzy right now - feels a little "shaken"   Pertinent History  asthma, h/o migraines (pt denies migraines prior to accident)    Patient Stated Goals  "get my right side of my body congruent with my left side and feel confident again"    Currently in Pain?  No/denies    Pain Onset  1 to 4 weeks ago           Sensory Organization Test composite score 62/100 with N= 70/100  Somatosensory WNL's Visual below N at 62/100 with N=74/100 Vestibular below N at 20/100 with N= 55/100  Condition 1 - all 3 trials WNL's Condition 2 - trials 1 & 3 WNL's: trial 2 below N Condition 3 - trials 1 & 3 below N;  trial 2 WNL's Condition 4 - trial 1 below N; trials 2 & 3 WNL's Condition 5 - trial 1 WNL's;  FALL on trials 2 & 3 Condition 6 - trial 1 below N:  Trials 2 & 3 WNL's                 Balance Exercises - 01/12/20 2137      Balance Exercises: Standing   Standing Eyes Opened  Narrow base of support (BOS);Wide (BOA);Head turns;Foam/compliant surface;5 reps    Standing Eyes Closed  Narrow base of support (BOS);Wide (BOA);Head turns;Foam/compliant surface;5 reps        PT Education - 01/12/20 2138    Education Details  balance on foam - 4 positions    Person(s) Educated  Patient    Methods  Explanation;Demonstration;Handout    Comprehension  Verbalized understanding;Returned demonstration          PT Long Term Goals - 01/12/20 2148      PT LONG TERM GOAL #1   Title  Pt will demo improved VOR function by testing WNL's for DVA with no c/o dizziness upon completion of test.    Baseline  1 line difference with c/o dizziness    Time  4    Period  Weeks    Status  New      PT LONG TERM GOAL #2   Title  Pt will amb. 43' with horizontal head turns with dizziness rating </= 1/10 for incr. ease with environmental scanning.    Baseline  4/10 intensity    Time  4    Period  Weeks    Status  New      PT LONG TERM GOAL #3   Title  Complete SOT and establish LTG as appropriate.                Improve vestibular input to >/= 50/100 for improved balance and mobility.    Time  6    Period  Weeks    Status  New      PT LONG TERM GOAL #4   Title  Pt will subjectively report at least 75% improvement in dizziness, Rt shoulder pain, and RLE mobility/flexibility.    Time  4    Period  Weeks    Status  New      PT LONG TERM GOAL #5   Title  Independent in HEP for balance, vestibular and aquatic exercises.    Time  4    Period  Weeks    Status  New            Plan - 01/12/20 2141    Clinical Impression Statement  Pt's SOT score is mildly decreased with composite score  62/100 with N=70/100; somatosensory input is WNL's, visual is decreased with score 62/100 with N= 74/100: vestibular is moderately decr. with score 20/100 with N=55/100.    Personal Factors and Comorbidities  Comorbidity 1;Time since onset of injury/illness/exacerbation;Fitness;Age    Comorbidities  asthma, migraines    Examination-Activity Limitations  Locomotion Level;Bed Mobility;Squat;Stand;Reach Overhead;Bend    Examination-Participation Restrictions  Cleaning;Meal Prep;Community Activity;Driving;Yard Work;Interpersonal Relationship;Laundry;Other   work   Stability/Clinical Decision Making  Stable/Uncomplicated    Rehab Potential  Good    PT Frequency  2x / week    PT Duration  4 weeks    PT Treatment/Interventions  ADLs/Self Care Home Management;Balance training;Vestibular;Neuromuscular re-education;Patient/family education;Aquatic Therapy;Therapeutic activities;Gait training;Therapeutic exercise    PT Next Visit Plan  check balance on foam exercises (HEP)    PT Home Exercise Plan  x1 viewing; balance on foam exs.    Consulted and Agree with Plan of Care  Patient       Patient will benefit from skilled therapeutic intervention in order to improve the following deficits and impairments:  Dizziness, Decreased balance, Decreased activity tolerance, Pain, Decreased strength, Difficulty walking, Impaired UE functional use  Visit Diagnosis: Unsteadiness on feet  Dizziness and giddiness     Problem List Patient Active Problem List   Diagnosis Date Noted  . Post concussion syndrome 12/17/2019  . History of asthma 12/17/2019    Kary Kos, PT 01/12/2020, 9:53 PM  [place] [facility] [address] [place], [place], [address] Phone: [phone]   Fax:  [phone]  Name: Cyleigh Massaro MRN: [id number] Date of Birth: Jun 25, 1991

## 2020-01-26 ENCOUNTER — Ambulatory Visit: Payer: No Typology Code available for payment source | Attending: Family Medicine | Admitting: Physical Therapy

## 2020-01-26 ENCOUNTER — Other Ambulatory Visit: Payer: Self-pay

## 2020-01-26 DIAGNOSIS — R42 Dizziness and giddiness: Secondary | ICD-10-CM | POA: Diagnosis present

## 2020-01-26 DIAGNOSIS — R2689 Other abnormalities of gait and mobility: Secondary | ICD-10-CM | POA: Insufficient documentation

## 2020-01-26 DIAGNOSIS — R2681 Unsteadiness on feet: Secondary | ICD-10-CM | POA: Diagnosis present

## 2020-01-27 ENCOUNTER — Encounter: Payer: Self-pay | Admitting: Physical Therapy

## 2020-01-27 NOTE — Therapy (Signed)
[place] [address] [place], [place], [address] Phone: [phone]   Fax:  [phone]  Physical Therapy Treatment  Patient Details  Name: Lindsey Duran MRN: [id number] Date of Birth: 16-May-1991 Referring Provider (PT): [name], MD   Encounter Date: 01/26/2020  PT End of Session - 01/27/20 1954    Visit Number  3    Number of Visits  9    Date for PT Re-Evaluation  02/19/20   extended target date due to delay in initiating treatment due to no appt availability   Authorization Type  [employer] Gemma Rule    PT Start Time  [phone]    PT Stop Time  [phone]    PT Time Calculation (min)  41 min    Activity Tolerance  Patient tolerated treatment well    Behavior During Therapy  [facility] for tasks assessed/performed       Past Medical History:  Diagnosis Date  . Anemia   . Asthma   . Migraines     Past Surgical History:  Procedure Laterality Date  . TONSILECTOMY, ADENOIDECTOMY, BILATERAL MYRINGOTOMY AND TUBES      There were no vitals filed for this visit.                         Balance Exercises - 01/27/20 1953      Balance Exercises: Standing   Standing Eyes Opened  Narrow base of support (BOS);Wide (BOA);Head turns;Foam/compliant surface;5 reps    Standing Eyes Closed  Narrow base of support (BOS);Wide (BOA);Head turns;Foam/compliant surface;5 reps    Rockerboard  Anterior/posterior;Head turns;EO;EC;Other reps (comment)   20 reps total - 10 reps each   Gait with Head Turns  Forward;1 rep   35' x 1 rep     Pt performed amb. 35' x 1 rep each direction holding ball and making patterns clockwise, counterclockwise, "V" and "+" patterns - tracking with eyes with head movement for improved VOR and gaze stabilization  Pt performed activity of reaching down to pick up cone off floor - turning 180 degrees to place up onto cabinet shelf and then placing cone Back down on floor with turns to Rt  and Lt sides  Trunk rotations with EO/EC with standing on pillows in corner - straight across, X patterns - 5 reps each direction  Marching on floor and on pillows with head turns - with EO and EC 5 reps each      PT Long Term Goals - 01/27/20 1958      PT LONG TERM GOAL #1   Title  Pt will demo improved VOR function by testing WNL's for DVA with no c/o dizziness upon completion of test.    Baseline  1 line difference with c/o dizziness    Time  4    Period  Weeks    Status  New      PT LONG TERM GOAL #2   Title  Pt will amb. 73' with horizontal head turns with dizziness rating </= 1/10 for incr. ease with environmental scanning.    Baseline  4/10 intensity    Time  4    Period  Weeks    Status  New      PT LONG TERM GOAL #3   Title  Complete SOT and establish LTG as appropriate.                Improve vestibular input to >/= 50/100 for improved  balance and mobility.    Time  6    Period  Weeks    Status  New      PT LONG TERM GOAL #4   Title  Pt will subjectively report at least 75% improvement in dizziness, Rt shoulder pain, and RLE mobility/flexibility.    Time  4    Period  Weeks    Status  New      PT LONG TERM GOAL #5   Title  Independent in HEP for balance, vestibular and aquatic exercises.    Time  4    Period  Weeks    Status  New            Plan - 01/27/20 1955    Clinical Impression Statement  Pt tolerated visual/vestibular exercises fairly well with min c/o dizziness with pt describing feeling experienced as "shaken up".  Pt required only 1-2 short seated rest breaks during 45" session.  Pt did report that the fluorescent lights in clinic bothered her some - forgot to bring sunglasses to today's session.    Personal Factors and Comorbidities  Comorbidity 1;Time since onset of injury/illness/exacerbation;Fitness;Age    Comorbidities  asthma, migraines    Examination-Activity Limitations  Locomotion Level;Bed Mobility;Squat;Stand;Reach Overhead;Bend     Examination-Participation Restrictions  Cleaning;Meal Prep;Community Activity;Driving;Yard Work;Interpersonal Relationship;Laundry;Other   work   Stability/Clinical Decision Making  Stable/Uncomplicated    Rehab Potential  Good    PT Frequency  2x / week    PT Duration  4 weeks    PT Treatment/Interventions  ADLs/Self Care Home Management;Balance training;Vestibular;Neuromuscular re-education;Patient/family education;Aquatic Therapy;Therapeutic activities;Gait training;Therapeutic exercise    PT Next Visit Plan  check balance on foam exercises (HEP)    PT Home Exercise Plan  x1 viewing; balance on foam exs.    Consulted and Agree with Plan of Care  Patient       Patient will benefit from skilled therapeutic intervention in order to improve the following deficits and impairments:  Dizziness, Decreased balance, Decreased activity tolerance, Pain, Decreased strength, Difficulty walking, Impaired UE functional use  Visit Diagnosis: Unsteadiness on feet  Dizziness and giddiness     Problem List Patient Active Problem List   Diagnosis Date Noted  . Post concussion syndrome 12/17/2019  . History of asthma 12/17/2019    Kary Kos, PT 01/27/2020, 7:59 PM  [place] [facility] [address] [place], [place], [address] Phone: [phone]   Fax:  [phone]  Name: Lindsey Duran MRN: [id number] Date of Birth: [birth year]

## 2020-02-02 ENCOUNTER — Other Ambulatory Visit: Payer: Self-pay

## 2020-02-02 ENCOUNTER — Ambulatory Visit: Payer: No Typology Code available for payment source | Admitting: Physical Therapy

## 2020-02-02 DIAGNOSIS — R2681 Unsteadiness on feet: Secondary | ICD-10-CM | POA: Diagnosis not present

## 2020-02-02 DIAGNOSIS — R42 Dizziness and giddiness: Secondary | ICD-10-CM

## 2020-02-03 ENCOUNTER — Encounter: Payer: Self-pay | Admitting: Physical Therapy

## 2020-02-03 NOTE — Therapy (Signed)
[place] [address] [place], [place], [address] Phone: [phone]   Fax:  [phone]  Physical Therapy Treatment  Patient Details  Name: Lindsey Duran MRN: [id number] Date of Birth: 02-10-1991 Referring Provider (PT): [name], MD   Encounter Date: 02/02/2020  PT End of Session - 02/03/20 2205    Visit Number  4    Number of Visits  9    Date for PT Re-Evaluation  02/19/20   extended target date due to delay in initiating treatment due to no appt availability   Authorization Type  [employer] Rule    PT Start Time  [phone]    PT Stop Time  0930    PT Time Calculation (min)  42 min    Activity Tolerance  Patient tolerated treatment well    Behavior During Therapy  [facility] for tasks assessed/performed       Past Medical History:  Diagnosis Date  . Anemia   . Asthma   . Migraines     Past Surgical History:  Procedure Laterality Date  . TONSILECTOMY, ADENOIDECTOMY, BILATERAL MYRINGOTOMY AND TUBES      There were no vitals filed for this visit.                         Balance Exercises - 02/03/20 1621      Balance Exercises: Standing   Rockerboard  Anterior/posterior;Head turns;EO;EC;Other reps (comment)   20 reps total - 10 reps each            PT Long Term Goals - 01/27/20 1958      PT LONG TERM GOAL #1   Title  Pt will demo improved VOR function by testing WNL's for DVA with no c/o dizziness upon completion of test.    Baseline  1 line difference with c/o dizziness    Time  4    Period  Weeks    Status  New      PT LONG TERM GOAL #2   Title  Pt will amb. 77' with horizontal head turns with dizziness rating </= 1/10 for incr. ease with environmental scanning.    Baseline  4/10 intensity    Time  4    Period  Weeks    Status  New      PT LONG TERM GOAL #3   Title  Complete SOT and establish LTG as appropriate.                Improve vestibular input to >/=  50/100 for improved balance and mobility.    Time  6    Period  Weeks    Status  New      PT LONG TERM GOAL #4   Title  Pt will subjectively report at least 75% improvement in dizziness, Rt shoulder pain, and RLE mobility/flexibility.    Time  4    Period  Weeks    Status  New      PT LONG TERM GOAL #5   Title  Independent in HEP for balance, vestibular and aquatic exercises.    Time  4    Period  Weeks    Status  New              Patient will benefit from skilled therapeutic intervention in order to improve the following deficits and impairments:     Visit Diagnosis: Unsteadiness on feet  Dizziness and giddiness  Problem List Patient Active Problem List   Diagnosis Date Noted  . Post concussion syndrome 12/17/2019  . History of asthma 12/17/2019    Kary Kos, PT 02/03/2020, 10:07 PM  [place] [facility] [address] [place], [place], [address] Phone: [phone]   Fax:  [phone]  Name: Lindsey Duran MRN: [id number] Date of Birth: 09-Mar-1991

## 2020-02-08 ENCOUNTER — Other Ambulatory Visit: Payer: Self-pay

## 2020-02-09 ENCOUNTER — Ambulatory Visit: Payer: No Typology Code available for payment source | Admitting: Physical Therapy

## 2020-02-09 ENCOUNTER — Encounter: Payer: Self-pay | Admitting: Physical Therapy

## 2020-02-09 ENCOUNTER — Other Ambulatory Visit: Payer: Self-pay

## 2020-02-09 DIAGNOSIS — R42 Dizziness and giddiness: Secondary | ICD-10-CM

## 2020-02-09 DIAGNOSIS — R2681 Unsteadiness on feet: Secondary | ICD-10-CM | POA: Diagnosis not present

## 2020-02-10 NOTE — Therapy (Signed)
[facility] [address] [place], [place], [address] Phone: [phone]   Fax:  [phone]  Physical Therapy Treatment  Patient Details  Name: Lindsey Duran MRN: [id number] Date of Birth: 11/11/1990 Referring Provider (PT): [name], MD   Encounter Date: 02/09/2020  PT End of Session - 02/10/20 1948    Visit Number  5    Number of Visits  9    Date for PT Re-Evaluation  02/19/20   extended target date due to delay in initiating treatment due to no appt availability   Authorization Type  [employer]    PT Start Time  [phone]    PT Stop Time  0930    PT Time Calculation (min)  43 min    Activity Tolerance  Patient tolerated treatment well    Behavior During Therapy  [facility] for tasks assessed/performed       Past Medical History:  Diagnosis Date  . Anemia   . Asthma   . Migraines     Past Surgical History:  Procedure Laterality Date  . TONSILECTOMY, ADENOIDECTOMY, BILATERAL MYRINGOTOMY AND TUBES      There were no vitals filed for this visit.                         Balance Exercises - 02/10/20 1945      Balance Exercises: Standing   Standing Eyes Opened  Narrow base of support (BOS);Head turns;Foam/compliant surface;5 reps   horizontal & vertical head turns   Rockerboard  Anterior/posterior;Head turns;EO;EC;Other reps (comment)   20 reps total - 10 reps each   Retro Gait  2 reps   25'    Turning  Both   4 reps - 2 to Rt/Lt sides   Other Standing Exercises  pt amb. making circles clockwise, counterclockwise, "V" and "+" patterns 30' x 2 reps each direction      Pt performed marching on incline on blue mat - EO and EC; head turns horizontally and vertically EO and then with EC 5 reps each Alternate stepping forward/back with head turns EO  Pt performed figure 8's with ball between knees - 180 degree turns 5 reps for habituation with turning 3 reps with 360 degree turns  on mat  Pt performed kettle bells with 5# weight 5 reps  Pt performed jogging forward 40' x 4 reps   Pt amb. 115' with no head turns between activities to allow dizziness to settle prior to continuing to next vestibular activity     PT Long Term Goals - 02/09/20 0854      PT LONG TERM GOAL #1   Title  Pt will demo improved VOR function by testing WNL's for DVA with no c/o dizziness upon completion of test.    Baseline  1 line difference with c/o dizziness    Time  4    Period  Weeks    Status  New      PT LONG TERM GOAL #2   Title  Pt will amb. 35' with horizontal head turns with dizziness rating </= 1/10 for incr. ease with environmental scanning.    Baseline  4/10 intensity    Time  4    Period  Weeks    Status  New      PT LONG TERM GOAL #3   Title  Complete SOT and establish LTG as appropriate.  Improve vestibular input to >/= 50/100 for improved balance and mobility.    Time  6    Period  Weeks    Status  New      PT LONG TERM GOAL #4   Title  Pt will subjectively report at least 75% improvement in dizziness, Rt shoulder pain, and RLE mobility/flexibility.    Time  4    Period  Weeks    Status  New      PT LONG TERM GOAL #5   Title  Independent in HEP for balance, vestibular and aquatic exercises.    Time  4    Period  Weeks    Status  New              Patient will benefit from skilled therapeutic intervention in order to improve the following deficits and impairments:  Dizziness, Decreased balance, Decreased activity tolerance, Pain, Decreased strength, Difficulty walking, Impaired UE functional use  Visit Diagnosis: Unsteadiness on feet  Dizziness and giddiness     Problem List Patient Active Problem List   Diagnosis Date Noted  . Post concussion syndrome 12/17/2019  . History of asthma 12/17/2019    Kary Kos, PT 02/10/2020, 7:54 PM  [facility] [address] [place], [place], [address] Phone: [phone]   Fax:  [phone]  Name: Lindsey Duran MRN: [id number] Date of Birth: June 09, 1991

## 2020-02-11 ENCOUNTER — Other Ambulatory Visit: Payer: Self-pay

## 2020-02-11 ENCOUNTER — Encounter: Payer: Self-pay | Admitting: Family Medicine

## 2020-02-11 ENCOUNTER — Ambulatory Visit (INDEPENDENT_AMBULATORY_CARE_PROVIDER_SITE_OTHER): Payer: No Typology Code available for payment source | Admitting: Family Medicine

## 2020-02-11 VITALS — BP 110/70 | HR 66 | Temp 97.2°F | Wt 152.0 lb

## 2020-02-11 DIAGNOSIS — Z0000 Encounter for general adult medical examination without abnormal findings: Secondary | ICD-10-CM | POA: Diagnosis not present

## 2020-02-11 DIAGNOSIS — R1013 Epigastric pain: Secondary | ICD-10-CM | POA: Diagnosis not present

## 2020-02-11 DIAGNOSIS — Z13220 Encounter for screening for lipoid disorders: Secondary | ICD-10-CM | POA: Diagnosis not present

## 2020-02-11 DIAGNOSIS — E049 Nontoxic goiter, unspecified: Secondary | ICD-10-CM | POA: Diagnosis not present

## 2020-02-11 DIAGNOSIS — Z87820 Personal history of traumatic brain injury: Secondary | ICD-10-CM | POA: Diagnosis not present

## 2020-02-11 DIAGNOSIS — F0781 Postconcussional syndrome: Secondary | ICD-10-CM

## 2020-02-11 LAB — TSH: TSH: 1.05 u[IU]/mL (ref 0.35–4.50)

## 2020-02-11 LAB — LIPID PANEL
Cholesterol: 180 mg/dL (ref 0–200)
HDL: 86.8 mg/dL (ref >39.00)
LDL Cholesterol: 84 mg/dL (ref 0–99)
NonHDL: 93.33
Total CHOL/HDL Ratio: 2
Triglycerides: 46 mg/dL (ref 0.0–149.0)
VLDL: 9.2 mg/dL (ref 0.0–40.0)

## 2020-02-11 LAB — COMPREHENSIVE METABOLIC PANEL
ALT: 13 U/L (ref 0–35)
AST: 15 U/L (ref 0–37)
Albumin: 4.2 g/dL (ref 3.5–5.2)
Alkaline Phosphatase: 47 U/L (ref 39–117)
BUN: 10 mg/dL (ref 6–23)
CO2: 31 mEq/L (ref 19–32)
Calcium: 9.8 mg/dL (ref 8.4–10.5)
Chloride: 103 mEq/L (ref 96–112)
Creatinine, Ser: 0.89 mg/dL (ref 0.40–1.20)
GFR: 91.26 mL/min (ref >60.00)
Glucose, Bld: 59 mg/dL — ABNORMAL LOW (ref 70–99)
Potassium: 4.4 mEq/L (ref 3.5–5.1)
Sodium: 140 mEq/L (ref 135–145)
Total Bilirubin: 0.4 mg/dL (ref 0.2–1.2)
Total Protein: 7.1 g/dL (ref 6.0–8.3)

## 2020-02-11 LAB — CBC
HCT: 40.3 % (ref 36.0–46.0)
Hemoglobin: 13.4 g/dL (ref 12.0–15.0)
MCHC: 33.3 g/dL (ref 30.0–36.0)
MCV: 93.9 fl (ref 78.0–100.0)
Platelets: 222 10*3/uL (ref 150.0–400.0)
RBC: 4.29 Mil/uL (ref 3.87–5.11)
RDW: 12.9 % (ref 11.5–15.5)
WBC: 3.5 10*3/uL — ABNORMAL LOW (ref 4.0–10.5)

## 2020-02-11 LAB — HEMOGLOBIN A1C: Hgb A1c MFr Bld: 5.3 % (ref 4.6–6.5)

## 2020-02-11 LAB — LIPASE: Lipase: 48 U/L (ref 11.0–59.0)

## 2020-02-11 LAB — T4, FREE: Free T4: 0.86 ng/dL (ref 0.60–1.60)

## 2020-02-11 MED ORDER — PANTOPRAZOLE SODIUM 20 MG PO TBEC: 20.0000 mg | DELAYED_RELEASE_TABLET | Freq: Every day | ORAL | 3 refills | Status: DC

## 2020-02-11 NOTE — Patient Instructions (Signed)
Preventive Care 21-28 Years Old, Female Preventive care refers to visits with your health care provider and lifestyle choices that can promote health and wellness. This includes:  A yearly physical exam. This may also be called an annual well check.  Regular dental visits and eye exams.  Immunizations.  Screening for certain conditions.  Healthy lifestyle choices, such as eating a healthy diet, getting regular exercise, not using drugs or products that contain nicotine and tobacco, and limiting alcohol use. What can I expect for my preventive care visit? Physical exam Your health care provider will check your:  Height and weight. This may be used to calculate body mass index (BMI), which tells if you are at a healthy weight.  Heart rate and blood pressure.  Skin for abnormal spots. Counseling Your health care provider may ask you questions about your:  Alcohol, tobacco, and drug use.  Emotional well-being.  Home and relationship well-being.  Sexual activity.  Eating habits.  Work and work environment.  Method of birth control.  Menstrual cycle.  Pregnancy history. What immunizations do I need?  Influenza (flu) vaccine  This is recommended every year. Tetanus, diphtheria, and pertussis (Tdap) vaccine  You may need a Td booster every 10 years. Varicella (chickenpox) vaccine  You may need this if you have not been vaccinated. Human papillomavirus (HPV) vaccine  If recommended by your health care provider, you may need three doses over 6 months. Measles, mumps, and rubella (MMR) vaccine  You may need at least one dose of MMR. You may also need a second dose. Meningococcal conjugate (MenACWY) vaccine  One dose is recommended if you are age 19-21 years and a first-year college student living in a residence hall, or if you have one of several medical conditions. You may also need additional booster doses. Pneumococcal conjugate (PCV13) vaccine  You may need  this if you have certain conditions and were not previously vaccinated. Pneumococcal polysaccharide (PPSV23) vaccine  You may need one or two doses if you smoke cigarettes or if you have certain conditions. Hepatitis A vaccine  You may need this if you have certain conditions or if you travel or work in places where you may be exposed to hepatitis A. Hepatitis B vaccine  You may need this if you have certain conditions or if you travel or work in places where you may be exposed to hepatitis B. Haemophilus influenzae type b (Hib) vaccine  You may need this if you have certain conditions. You may receive vaccines as individual doses or as more than one vaccine together in one shot (combination vaccines). Talk with your health care provider about the risks and benefits of combination vaccines. What tests do I need?  Blood tests  Lipid and cholesterol levels. These may be checked every 5 years starting at age 20.  Hepatitis C test.  Hepatitis B test. Screening  Diabetes screening. This is done by checking your blood sugar (glucose) after you have not eaten for a while (fasting).  Sexually transmitted disease (STD) testing.  BRCA-related cancer screening. This may be done if you have a family history of breast, ovarian, tubal, or peritoneal cancers.  Pelvic exam and Pap test. This may be done every 3 years starting at age 21. Starting at age 30, this may be done every 5 years if you have a Pap test in combination with an HPV test. Talk with your health care provider about your test results, treatment options, and if necessary, the need for more tests.   Follow these instructions at home: Eating and drinking   Eat a diet that includes fresh fruits and vegetables, whole grains, lean protein, and low-fat dairy.  Take vitamin and mineral supplements as recommended by your health care provider.  Do not drink alcohol if: ? Your health care provider tells you not to drink. ? You are  pregnant, may be pregnant, or are planning to become pregnant.  If you drink alcohol: ? Limit how much you have to 0-1 drink a day. ? Be aware of how much alcohol is in your drink. In the [place] one drink equals one 12 oz bottle of beer (355 mL), one 5 oz glass of wine (148 mL), or one 1 oz glass of hard liquor (44 mL). Lifestyle  Take daily care of your teeth and gums.  Stay active. Exercise for at least 30 minutes on 5 or more days each week.  Do not use any products that contain nicotine or tobacco, such as cigarettes, e-cigarettes, and chewing tobacco. If you need help quitting, ask your health care provider.  If you are sexually active, practice safe sex. Use a condom or other form of birth control (contraception) in order to prevent pregnancy and STIs (sexually transmitted infections). If you plan to become pregnant, see your health care provider for a preconception visit. What's next?  Visit your health care provider once a year for a well check visit.  Ask your health care provider how often you should have your eyes and teeth checked.  Stay up to date on all vaccines. This information is not intended to replace advice given to you by your health care provider. Make sure you discuss any questions you have with your health care provider. Document Revised: 06/19/2018 Document Reviewed: 06/19/2018 Elsevier Patient Education  [place].  Post-Concussion Syndrome  A concussion is a brain injury from a direct hit (blow) to your head or body. This blow causes your brain to shake quickly back and forth inside your skull. This can damage brain cells and cause chemical changes in your brain. Concussions are usually not life-threatening but can cause several serious symptoms. Post-concussion syndrome is when symptoms that occur after a concussion last longer than normal. These symptoms can last from weeks to months. What are the causes? The cause of this condition is not known. It  can happen whether your head injury was mild or severe. What increases the risk? You are more likely to develop this condition if:  You are female.  You are a child, teen, or young adult.  You had a past head injury.  You have a history of headaches.  You have depression or anxiety. What are the signs or symptoms? Physical symptoms  Headaches.  Tiredness.  Dizziness.  Weakness.  Blurry vision.  Sensitivity to light.  Hearing difficulties. Mental and emotional symptoms  Memory difficulties.  Difficulty with concentration.  Difficulty sleeping or staying asleep.  Feeling irritable.  Anxiety or depression.  Difficulty learning new things. How is this diagnosed? This condition may be diagnosed based on:  Your symptoms.  A description of your injury.  Your medical history. Your health care provider may order other tests such as:  Brain function tests (neurological testing).  CT scan. How is this treated? Treatment for this condition may depend on your symptoms. Symptoms usually go away on their own over time. Treatments may include:  Medicines for headaches.  Resting your brain and body for a few days after your injury.  Rehabilitation therapy,  such as: ? Physical or occupational therapy. This may include exercises to help with balance and dizziness. ? Mental health counseling. ? Speech therapy. ? Vision therapy. A brain and eye specialist can recommend treatments for vision problems. Follow these instructions at home: Medicines  Take over-the-counter and prescription medicines only as told by your health care provider.  Avoid opioid prescription pain medicines when recovering from a concussion. Activity  Limit your mental activities for the first few days after your injury, such as: ? Homework or job-related work. ? Complex thinking. ? Watching TV, and using a computer or phone. ? Playing memory games and puzzles. ? Gradually return to your  normal activity level. If a certain activity brings on your symptoms, stop or slow down until you can do the activity without it triggering your symptoms.  Limit physical activity, such as exercise or sports, for the first few days after a concussion. Gradually return to normal activity as told by your health care provider. ? If a certain activity brings on your symptoms, stop or slow down until you can do the activity without it triggering your symptoms.  Rest. Rest helps your brain heal. Make sure you: ? Get plenty of sleep at night. Most adults should get at least 7-9 hours of sleep each night. ? Rest during the day. Take naps or rest breaks when you feel tired.  Do not do high-risk activities that could cause a second concussion, such as riding a bike or playing sports. Having another concussion before the first one has healed can be dangerous. General instructions  Do not drink alcohol until your health care provider says you can.  Keep track of the frequency and the severity of your symptoms. Give this information to your health care provider.  Keep all follow-up visits as directed by your health care provider. This is important. Contact a health care provider if:  Your symptoms do not improve.  You have another injury. Get help right away if you:  Have a severe or worsening headache.  Are confused.  Have trouble staying awake.  Pass out.  Vomit.  Have weakness or numbness in any part of your body.  Have a seizure.  Have trouble speaking. Summary  Post-concussion syndrome is when symptoms that occur after a concussion last longer than normal.  Symptoms usually go away on their own over time. Depending on your symptoms, you may need treatment, such as medicines or rehabilitation therapy.  Rest your brain and body for a few days after your injury. Gradually return to activities, as told by your health care provider.  Get plenty of sleep, and avoid alcohol and opioid  pain medicines while recovering from a concussion. This information is not intended to replace advice given to you by your health care provider. Make sure you discuss any questions you have with your health care provider. Document Revised: 07/31/2018 Document Reviewed: 11/12/2017 Elsevier Patient Education  [place]  A goiter is an enlarged thyroid gland. The thyroid is located in the lower front of the neck. It makes hormones that affect many body parts and systems, including the system that affects how quickly the body burns fuel for energy (metabolism). Most goiters are painless and are not a cause for concern. Some goiters can affect the way your thyroid makes thyroid hormones. Goiters and conditions that cause goiters can be treated, if necessary. What are the causes? Common causes of this condition include:  Lack (deficiency) of a mineral called iodine.  The thyroid gland uses iodine to make thyroid hormones.  Diseases that attack healthy cells in the body (autoimmune diseases) and affect thyroid function, such as Graves' disease or Hashimoto's disease. These diseases may cause the body to produce too much thyroid hormone (hyperthyroidism) or too little of the hormone (hypothyroidism).  Conditions that cause inflammation of the thyroid (thyroiditis).  One or more small growths on the thyroid (nodular goiter). Other causes include:  Medical problems caused by abnormal genes that are passed from parent to child (genetic defects).  Thyroid injury or infection.  Tumors that may or may not be cancerous.  Pregnancy.  Certain medicines.  Exposure to radiation. In some cases, the cause may not be known. What increases the risk? This condition is more likely to develop in:  People who do not get enough iodine in their diet.  People who have a family history of goiter.  Women.  People who are older than age 12.  People who smoke tobacco.  People who have  had exposure to radiation. What are the signs or symptoms? The main symptom of this condition is swelling in the lower, front part of the neck. This swelling can range from a very small bump to a large lump. Other symptoms may include:  A tight feeling in the throat.  A hoarse voice.  Coughing.  Wheezing.  Difficulty swallowing or breathing.  Bulging veins in the neck.  Dizziness. When a goiter is the result of an overactive thyroid (hyperthyroidism), symptoms may also include:  Nervousness or restlessness.  Inability to tolerate heat.  Unexplained weight loss.  Diarrhea.  Change in the texture of hair or skin.  Changes in heartbeat, such as skipped beats, extra beats, or a rapid heart rate.  Loss of menstruation.  Shaky hands.  Increased appetite.  Sleep problems. When a goiter is the result of an underactive thyroid (hypothyroidism), symptoms may also include:  Feeling like you have no energy (lethargy).  Inability to tolerate cold.  Weight gain that is not explained by a change in diet or exercise habits.  Dry skin.  Coarse hair.  Irregular menstrual periods.  Constipation.  Sadness or depression.  Fatigue. In some cases, there may not be any symptoms and the thyroid hormone levels may be normal. How is this diagnosed? This condition may be diagnosed based on your symptoms, your medical history, and a physical exam. You may have tests, such as:  Blood tests to check thyroid function.  Imaging tests, such as: ? Ultrasound. ? CT scan. ? MRI. ? Thyroid scan.  Removal of a tissue sample (biopsy) of the goiter or any nodules. The sample will be tested to check for cancer. How is this treated? Treatment for this condition depends on the cause and your symptoms. Treatment may include:  Medicines to regulate thyroid hormone levels.  Anti-inflammatory medicines or steroid medicines, if the goiter is caused by inflammation.  Iodine supplements or  changes to your diet, if the goiter is caused by iodine deficiency.  Radioactive iodine treatment.  Surgery to remove your thyroid. In some cases, you may only need regular check-ups with your health care provider to monitor your condition, and you may not need treatment. Follow these instructions at home:  Follow instructions from your health care provider about any changes to your diet.  Take over-the-counter and prescription medicines only as told by your health care provider. These include supplements.  Do not use any products that contain nicotine or tobacco, such as cigarettes and  e-cigarettes. If you need help quitting, ask your health care provider.  Keep all follow-up visits as told by your health care provider. This is important. Contact a health care provider if:  Your symptoms do not get better with treatment.  You have nausea, vomiting, or diarrhea. Get help right away if:  You have sudden, unexplained confusion or other mental changes.  You have a fever.  You have chest pain.  You have trouble breathing or swallowing.  You suddenly become very weak.  You experience extreme restlessness.  You feel your heart racing. Summary  A goiter is an enlarged thyroid gland.  The thyroid gland is located in the lower front of the neck. It makes hormones that affect many body parts and systems, including the system that affects how quickly the body burns fuel for energy (metabolism).  The main symptom of this condition is swelling in the lower, front part of the neck. This swelling can range from a very small bump to a large lump.  Treatment for this condition depends on the cause and your symptoms. You may need medicines, supplements, or regular monitoring of your condition. This information is not intended to replace advice given to you by your health care provider. Make sure you discuss any questions you have with your health care provider. Document Revised: 09/20/2017  Document Reviewed: 07/04/2017 Elsevier Patient Education  2020 [place].  Peptic Ulcer  A peptic ulcer is a painful sore in the lining of your stomach or the first part of your small intestine. What are the causes? Common causes of this condition include:  An infection.  Using certain pain medicines too often or too much. What increases the risk? You are more likely to get this condition if you:  Smoke.  Have a family history of ulcer disease.  Drink alcohol.  Have been hospitalized in an intensive care unit (ICU). What are the signs or symptoms? Symptoms include:  Burning pain in the area between the chest and the belly button. The pain may: ? Not go away (be persistent). ? Be worse when your stomach is empty. ? Be worse at night.  Heartburn.  Feeling sick to your stomach (nauseous) and throwing up (vomiting).  Bloating. If the ulcer results in bleeding, it can cause you to:  Have poop (stool) that is black and looks like tar.  Throw up bright red blood.  Throw up material that looks like coffee grounds. How is this treated? Treatment for this condition may include:  Stopping things that can cause the ulcer, such as: ? Smoking. ? Using pain medicines.  Medicines to reduce stomach acid.  Antibiotic medicines if the ulcer is caused by an infection.  A procedure that is done using a small, flexible tube that has a camera at the end (upper endoscopy). This may be done if you have a bleeding ulcer.  Surgery. This may be needed if: ? You have a lot of bleeding. ? The ulcer caused a hole somewhere in the digestive system. Follow these instructions at home:  Do not drink alcohol if your doctor tells you not to drink.  Limit how much caffeine you take in.  Do not use any products that contain nicotine or tobacco, such as cigarettes, e-cigarettes, and chewing tobacco. If you need help quitting, ask your doctor.  Take over-the-counter and prescription  medicines only as told by your doctor. ? Do not stop or change your medicines unless you talk with your doctor about it first. ?  Do not take aspirin, ibuprofen, or other NSAIDs unless your doctor told you to do so.  Keep all follow-up visits as told by your doctor. This is important. Contact a doctor if:  You do not get better in 7 days after you start treatment.  You keep having an upset stomach (indigestion) or heartburn. Get help right away if:  You have sudden, sharp pain in your belly (abdomen).  You have belly pain that does not go away.  You have bloody poop (stool) or black, tarry poop.  You throw up blood. It may look like coffee grounds.  You feel light-headed or feel like you may pass out (faint).  You get weak.  You get sweaty or feel sticky and cold to the touch (clammy). Summary  Symptoms of a peptic ulcer include burning pain in the area between the chest and the belly button.  Take medicines only as told by your doctor.  Limit how much alcohol and caffeine you have.  Keep all follow-up visits as told by your doctor. This information is not intended to replace advice given to you by your health care provider. Make sure you discuss any questions you have with your health care provider. Document Revised: 04/15/2018 Document Reviewed: 04/15/2018 Elsevier Patient Education  [place].  Heartburn Heartburn is a type of pain or discomfort that can happen in the throat or chest. It is often described as a burning pain. It may also cause a bad, acid-like taste in the mouth. Heartburn may feel worse when you lie down or bend over. It may be worse at night. It may be caused by stomach contents that move back up (reflux) into the tube that connects the mouth with the stomach (esophagus). Follow these instructions at home: Eating and drinking   Avoid certain foods and drinks as told by your doctor. This may include: ? Coffee and tea (with or without  caffeine). ? Drinks that have alcohol. ? Energy drinks and sports drinks. ? Carbonated drinks or sodas. ? Chocolate and cocoa. ? Peppermint and mint flavorings. ? Garlic and onions. ? Horseradish. ? Spicy and acidic foods, such as:  Peppers.  Chili powder and curry powder.  Vinegar.  Hot sauces and BBQ sauce. ? Citrus fruit juices and citrus fruits, such as:  Oranges.  Lemons.  Limes. ? Tomato-based foods, such as:  Red sauce and pizza with red sauce.  Chili.  Salsa. ? Fried and fatty foods, such as:  Donuts.  [place] fries and potato chips.  High-fat dressings. ? High-fat meats, such as:  Hot dogs and sausage.  Rib eye steak.  Ham and bacon. ? High-fat dairy items, such as:  Whole milk.  Butter.  Cream cheese.  Eat small meals often. Avoid eating large meals.  Avoid drinking large amounts of liquid with your meals.  Avoid eating meals during the 2-3 hours before bedtime.  Avoid lying down right after you eat.  Do not exercise right after you eat. Lifestyle      If you are overweight, lose an amount of weight that is healthy for you. Ask your doctor about a safe weight loss goal.  Do not use any products that contain nicotine or tobacco, including cigarettes, e-cigarettes, and chewing tobacco. These can make your symptoms worse. If you need help quitting, ask your doctor.  Wear loose clothes. Do not wear anything tight around your waist.  Raise (elevate) the head of your bed about 6 inches (15 cm) when you sleep.  Try to lower your stress. If you need help doing this, ask your doctor. General instructions  Pay attention to any changes in your symptoms.  Take over-the-counter and prescription medicines only as told by your doctor. ? Do not take aspirin, ibuprofen, or other NSAIDs unless your doctor says it is okay. ? Stop medicines only as told by your doctor.  Keep all follow-up visits as told by your doctor. This is  important. Contact a doctor if:  You have new symptoms.  You lose weight and you do not know why it is happening.  You have trouble swallowing, or it hurts to swallow.  You have wheezing or a cough that keeps happening.  Your symptoms do not get better with treatment.  You have heartburn often for more than 2 weeks. Get help right away if:  You have pain in your arms, neck, jaw, teeth, or back.  You feel sweaty, dizzy, or light-headed.  You have chest pain or shortness of breath.  You throw up (vomit) and your throw up looks like blood or coffee grounds.  Your poop (stool) is bloody or black. These symptoms may represent a serious problem that is an emergency. Do not wait to see if the symptoms will go away. Get medical help right away. Call your local emergency services (911 in the [place] Do not drive yourself to the hospital. Summary  Heartburn is a type of pain that can happen in the throat or chest. It can feel like a burning pain. It may also cause a bad, acid-like taste in the mouth.  You may need to avoid certain foods and drinks to help your symptoms. Ask your doctor what foods and drinks you should avoid.  Take over-the-counter and prescription medicines only as told by your doctor. Do not take aspirin, ibuprofen, or other NSAIDs unless your doctor told you to do so.  Contact your doctor if your symptoms do not get better or they get worse. This information is not intended to replace advice given to you by your health care provider. Make sure you discuss any questions you have with your health care provider. Document Revised: 03/10/2018 Document Reviewed: 03/10/2018 Elsevier Patient Education  [place].

## 2020-02-11 NOTE — Progress Notes (Signed)
Subjective:     Lindsey Duran is a 29 y.o. female with pmh sig for asthma and post concussion syndrome who is here for a comprehensive physical exam. The patient reports problems - abd pain, s/p concussion.  Pt with a tightening and burning sensation in stomach.  Initially noted symptoms with eating BBQ sauce and spaghetti so stopped eating those items.  Now symptoms occurring more frequently with or without food.  Pt notes bananas and mill seem to help.  Pt denies abd distention/bloating, increased flatus, RUQ, constipation.    Since last OFV, pt has been going to PT for post concussion syndrome after being hit in the head when a large piece of ice slid off a roof.  Notes feels good when in PT, but symptoms return when the session is over.  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  Has difficulty with concentration, intermittent dizziness, and photophobia at times when walking into a new environment.  Pt tries not to get frustrated about progress.  Social History   Socioeconomic History  . Marital status: Single    Spouse name: Not on file  . Number of children: 0  . Years of education: Not on file  . Highest education level: Master's degree (e.g., MA, MS, MEng, MEd, MSW, MBA)  Occupational History  . Not on file  Tobacco Use  . Smoking status: Current Every Day Smoker    Packs/day: 0.25    Years: 5.00    Pack years: 1.25    Types: Cigarettes  . Smokeless tobacco: Never Used  Substance and Sexual Activity  . Alcohol use: Not Currently  . Drug use: Yes    Frequency: 1.0 times per week    Types: Marijuana  . Sexual activity: Yes    Birth control/protection: None  Other Topics Concern  . Not on file  Social History Narrative  . Not on file   Social Determinants of Health   Financial Resource Strain:   . Difficulty of Paying Living Expenses:   Food Insecurity:   . Worried About Programme researcher, broadcasting/film/video in the Last Year:   . Barista in the Last  Year:   Transportation Needs:   . Freight forwarder (Medical):   Marland Kitchen Lack of Transportation (Non-Medical):   Physical Activity:   . Days of Exercise per Week:   . Minutes of Exercise per Session:   Stress:   . Feeling of Stress :   Social Connections:   . Frequency of Communication with Friends and Family:   . Frequency of Social Gatherings with Friends and Family:   . Attends Religious Services:   . Active Member of Clubs or Organizations:   . Attends Banker Meetings:   Marland Kitchen Marital Status:   Intimate Partner Violence:   . Fear of Current or Ex-Partner:   . Emotionally Abused:   Marland Kitchen Physically Abused:   . Sexually Abused:    Health Maintenance  Topic Date Due  . HIV Screening  Never done  . COVID-19 Vaccine (1) Never done  . TETANUS/TDAP  Never done  . PAP-Cervical Cytology Screening  Never done  . PAP SMEAR-Modifier  Never done  . INFLUENZA VACCINE  05/22/2020    The following portions of the patient's history were reviewed and updated as appropriate: allergies, current medications, past family history, past medical history, past social history, past surgical history and problem list.  Review of Systems Pertinent items noted  in HPI and remainder of comprehensive ROS otherwise negative.   Objective:    BP 110/70 (BP Location: Left Arm, Patient Position: Sitting, Cuff Size: Normal)   Pulse 66   Temp (!) 97.2 F (36.2 C) (Temporal)   Wt 152 lb (68.9 kg)   LMP 01/04/2020 (Approximate)   SpO2 98%   BMI 23.11 kg/m  General appearance: alert, cooperative and no distress Head: Normocephalic, without obvious abnormality, atraumatic Eyes: conjunctivae/corneas clear. PERRL, EOM's intact. Fundi benign. Ears: normal TM's and external ear canals both ears Nose: Nares normal. Septum midline. Mucosa normal. No drainage or sinus tenderness. Throat: lips, mucosa, and tongue normal; teeth and gums normal Neck: no adenopathy, no carotid bruit, no JVD, supple,  symmetrical, trachea midline and R lobe thyroid mildly enlarged, smooth, without nodules.  L lobe normal without enlargement or nodules. Lungs: clear to auscultation bilaterally Heart: regular rate and rhythm, S1, S2 normal, no murmur, click, rub or gallop Abdomen: normoactive BS, soft, mild TTP of epigastric area, non distended. No TTP of RUQ.  No hepatosplenomegaly.   Extremities: extremities normal, atraumatic, no cyanosis or edema Pulses: 2+ and symmetric Skin: Skin color, texture, turgor normal. No rashes or lesions Lymph nodes: Cervical, supraclavicular, and axillary nodes normal. Neurologic: Alert and oriented X 3, normal strength and tone. Normal symmetric reflexes. Normal coordination and gait   Psych:  Pleasant, well groomed, congruent but slightly slowed/deliberate thought process and speech.  Mood appropriate.   Assessment:    Healthy female exam s/p concussion with mild enlargement of thyroid R lobe on exam and epigastric pain.   Plan:     Anticipatory guidance given including wearing seatbelts, smoke detectors in the home, increasing physical activity, increasing p.o. intake of water and vegetables. -will obtain labs -discussed pap.  Pt declines at this time.  States will schedule at a later date. -given handout -next CPE in 1 yr See After Visit Summary for Counseling Recommendations    Post concussion syndrome -stable -encouraged pt not to get frustrated. -continue slowly increasing activity as tolerated.   -limit mental strain -Continue PT  Goiter  -noted on exam, mild enlargement of R lobe -given handout - Plan: TSH, T4, Free  Epigastric pain  -discussed possible causes including GERD, gastric or peptic ulcer, cholecystitis- though no RUQ pain on exam -will start PPI -keep food diary with associated symptoms -for continued symptoms will place referral to GI for EGD. - Plan: pantoprazole (PROTONIX) 20 MG tablet, CBC (no diff), Comprehensive metabolic panel,  Lipase  Screening for cholesterol level  - Plan: Lipid panel  F/u in 1 month, sooner   Grier Mitts, MD

## 2020-02-12 ENCOUNTER — Encounter: Payer: Self-pay | Admitting: Family Medicine

## 2020-02-15 ENCOUNTER — Telehealth: Payer: Self-pay

## 2020-02-15 NOTE — Telephone Encounter (Signed)
Left message for patient about renewing [facility]CP in order for her to have her follow-up done with [facility]. Left number for patient to call back.

## 2020-02-16 ENCOUNTER — Other Ambulatory Visit: Payer: Self-pay

## 2020-02-16 ENCOUNTER — Ambulatory Visit: Payer: No Typology Code available for payment source | Admitting: Physical Therapy

## 2020-02-16 DIAGNOSIS — R2681 Unsteadiness on feet: Secondary | ICD-10-CM

## 2020-02-16 DIAGNOSIS — R42 Dizziness and giddiness: Secondary | ICD-10-CM

## 2020-02-16 DIAGNOSIS — R2689 Other abnormalities of gait and mobility: Secondary | ICD-10-CM

## 2020-02-17 ENCOUNTER — Encounter: Payer: Self-pay | Admitting: Physical Therapy

## 2020-02-17 NOTE — Therapy (Signed)
[facility] [address] [place], [place], [address] Phone: [phone]   Fax:  [phone]  Physical Therapy Treatment  Patient Details  Name: Lindsey Duran MRN: [id number] Date of Birth: 06/19/1991 Referring Provider (PT): [name], MD   Encounter Date: 02/16/2020  PT End of Session - 02/16/20 0854    Visit Number  6    Number of Visits  9    Date for PT Re-Evaluation  02/19/20   extended target date due to delay in initiating treatment due to no appt availability   Authorization Type  [employer] [place] [employer]    PT Start Time  [phone]    PT Stop Time  [phone]    PT Time Calculation (min)  41 min    Activity Tolerance  Patient tolerated treatment well    Behavior During Therapy  [facility] for tasks assessed/performed       Past Medical History:  Diagnosis Date  . Anemia   . Asthma   . Migraines     Past Surgical History:  Procedure Laterality Date  . TONSILECTOMY, ADENOIDECTOMY, BILATERAL MYRINGOTOMY AND TUBES      There were no vitals filed for this visit.  Subjective Assessment - 02/16/20 0856    Subjective  Pt states she had some sensory overload over the weekend with family in town and with brother in from [place] - all the movement and stimulation bothered her some; states she had to slow down   pt states she is a little dizzy right now - feels a little "shaken"   Pertinent History  asthma, h/o migraines (pt denies migraines prior to accident)    Patient Stated Goals  "get my right side of my body congruent with my left side and feel confident again"    Currently in Pain?  No/denies    Pain Onset  1 to 4 weeks ago                       [facility] Adult PT Treatment/Exercise - 02/17/20 0001      Ambulation/Gait   Ambulation/Gait  Yes    Ambulation/Gait Assistance  6: Modified independent (Device/Increase time)    Ambulation Distance (Feet)  100 Feet    Assistive device  None    Gait Pattern   Within Functional Limits    Ambulation Surface  Level;Indoor    Gait Comments  horizontal head turns 50'; vertical head turns 50'          Balance Exercises - 02/17/20 1213      Balance Exercises: Standing   Rockerboard  Anterior/posterior;Head turns;EO;EC;Other reps (comment)   20 reps total - 10 reps each   Retro Gait  1 rep;Other (comment)   tossing ball   Turning  Both;Other reps (comment)   4 reps to each side - standing on blue mat   Other Standing Exercises  pt amb. making circles clockwise, counterclockwise, "V" and "+" patterns 30' x 2 reps each direction    Other Standing Exercises Comments  Pt performed jogging forwards/backwards 40' x 2 reps; jogging sideways 40' x 2 reps - pt reported feeling nauseous after this activity       Pt performed fig. 8 with ball between legs - standing on blue mat - 180 degree turn - 3 reps to each side; then progressed To fig. 8 with ball between legs - 360 degree turns 2 reps - 1 to Rt and 1 to Lt side  Diagonal patterns with ball - reaching down toward Lt foot =up and moving ball up to Rt side with looking up 3 reps and then Reversed it with reaching down to Rt side and moving ball to Lt side in diagonal pattern 3 reps  Pt amb. Tossing ball straight up for 35', then on Rt side & on Lt side 35' x 1 rep   Standing on Bosu - 5 squats EO without UE support; 5 squats EC with CGA without UE support on // bars   Marching forward and back on blue mat on floor with CGA with horizontal head turns x 2 reps; vertical head turns 2 reps with SBA     PT Long Term Goals - 02/16/20 0855      PT LONG TERM GOAL #1   Title  Pt will demo improved VOR function by testing WNL's for DVA with no c/o dizziness upon completion of test.    Baseline  1 line difference with c/o dizziness; met 02-16-20    Time  4    Period  Weeks    Status  Partially Met    Target Date  04/01/20      PT LONG TERM GOAL #2   Title  Pt will amb. 60' with horizontal head turns  with dizziness rating </= 1/10 for incr. ease with environmental scanning.    Baseline  4/10 intensity on 02-16-20    Time  4    Period  Weeks    Status  Not Met    Target Date  04/01/20      PT LONG TERM GOAL #3   Title  Complete SOT and establish LTG as appropriate.                Improve vestibular input to >/= 50/100 for improved balance and mobility.    Time  6    Period  Weeks    Status  On-going    Target Date  04/01/20      PT LONG TERM GOAL #4   Title  Pt will subjectively report at least 75% improvement in dizziness, Rt shoulder pain, and RLE mobility/flexibility.    Baseline  40-50% improvement; Rt shoulder pain still occurs intermittently    Time  4    Period  Weeks    Status  On-going    Target Date  04/01/20      PT LONG TERM GOAL #5   Title  Independent in HEP for balance, vestibular and aquatic exercises.    Baseline  met 02-16-20    Time  4    Period  Weeks    Status  Achieved            Plan - 02/17/20 1159    Clinical Impression Statement  Pt has partially met LTG #1 with DVA being a 1 line difference (WNL's), however, pt c/o dizziness after test completed, with head no longer moving.  LTG #2 not met as pt reports dizziness 4/10 intensity with amb. with head turns.  LTG #3 is ongoing as SOT has not been retested.  LTG #4 is ongoing as Rt shoulder pain is approx. 40-50% better, but not 75% improved per stated goal.  LTG #5 is met as pt is independent in HEP.  Aquatic therapy is scheduled to be initiated in approx. 2 weeks (due to appt availability for aquatic therapy).  Pt is progressing, with c/o nausea and headache at times with increased activity; urged pt to try to not  get frustrated but to continue with exercises slowly as tolerated.    Personal Factors and Comorbidities  Comorbidity 1;Time since onset of injury/illness/exacerbation;Fitness;Age    Comorbidities  asthma, migraines    Examination-Activity Limitations  Locomotion Level;Bed  Mobility;Squat;Stand;Reach Overhead;Bend    Examination-Participation Restrictions  Cleaning;Meal Prep;Community Activity;Driving;Yard Work;Interpersonal Relationship;Laundry;Other   work   Stability/Clinical Decision Making  Stable/Uncomplicated    Rehab Potential  Good    PT Frequency  1x / week    PT Duration  6 weeks    PT Treatment/Interventions  ADLs/Self Care Home Management;Balance training;Vestibular;Neuromuscular re-education;Patient/family education;Aquatic Therapy;Therapeutic activities;Gait training;Therapeutic exercise    PT Next Visit Plan  cont balance and vestibular exercises    PT Home Exercise Plan  x1 viewing; balance on foam exs.    Consulted and Agree with Plan of Care  Patient       Patient will benefit from skilled therapeutic intervention in order to improve the following deficits and impairments:  Dizziness, Decreased balance, Decreased activity tolerance, Pain, Decreased strength, Difficulty walking  Visit Diagnosis: Unsteadiness on feet - Plan: PT plan of care cert/re-cert  Dizziness and giddiness - Plan: PT plan of care cert/re-cert  Other abnormalities of gait and mobility - Plan: PT plan of care cert/re-cert     Problem List Patient Active Problem List   Diagnosis Date Noted  . Post concussion syndrome 12/17/2019  . History of asthma 12/17/2019    Alda Lea, PT 02/17/2020, 12:14 PM  [facility] [address] [place], [place], [address] Phone: [phone]   Fax:  [phone]  Name: Lindsey Duran MRN: [id number] Date of Birth: 1991/04/29

## 2020-03-01 ENCOUNTER — Other Ambulatory Visit: Payer: Self-pay

## 2020-03-01 ENCOUNTER — Ambulatory Visit: Payer: No Typology Code available for payment source | Attending: Family Medicine | Admitting: Physical Therapy

## 2020-03-01 DIAGNOSIS — R2681 Unsteadiness on feet: Secondary | ICD-10-CM | POA: Diagnosis not present

## 2020-03-01 DIAGNOSIS — R42 Dizziness and giddiness: Secondary | ICD-10-CM | POA: Diagnosis present

## 2020-03-01 DIAGNOSIS — R2689 Other abnormalities of gait and mobility: Secondary | ICD-10-CM | POA: Diagnosis present

## 2020-03-02 ENCOUNTER — Encounter: Payer: Self-pay | Admitting: Physical Therapy

## 2020-03-02 NOTE — Therapy (Signed)
[facility] [address] [place], [place], [address] Phone: [phone]   Fax:  [phone]  Physical Therapy Treatment  Patient Details  Name: Lindsey Duran MRN: [id number] Date of Birth: [birth year] Referring Provider (PT): [name], MD   Encounter Date: 03/01/2020  PT End of Session - 03/02/20 2050    Visit Number  7    Number of Visits  9    Date for PT Re-Evaluation  03/18/20   extended target date due to delay in initiating treatment due to no appt availability   Authorization Type  [employer] [place] [employer]    PT Start Time  [phone]    PT Stop Time  0932    PT Time Calculation (min)  41 min    Activity Tolerance  Patient tolerated treatment well    Behavior During Therapy  [facility] for tasks assessed/performed       Past Medical History:  Diagnosis Date  . Anemia   . Asthma   . Migraines     Past Surgical History:  Procedure Laterality Date  . TONSILECTOMY, ADENOIDECTOMY, BILATERAL MYRINGOTOMY AND TUBES      There were no vitals filed for this visit.                          Balance Exercises - 03/02/20 2048      Balance Exercises: Standing   Standing Eyes Closed  Narrow base of support (BOS);Wide (BOA);Head turns;Foam/compliant surface;5 reps    Rockerboard  Anterior/posterior;Head turns;EO;EC;Other reps (comment)   20 reps total - 10 reps each   Retro Gait  1 rep;Other (comment)   tossing ball   Turning  Both;Other reps (comment)   4 reps to each side - standing on blue mat   Other Standing Exercises  pt amb. making circles clockwise, counterclockwise, "V" and "+" patterns 30' x 2 reps each direction    Other Standing Exercises Comments  Pt performed jogging forwards/backwards 40' x 2 reps; jogging sideways 40' x 2 reps - pt reported feeling nauseous after this activity      Mini- trampoline activities - jumping with EO 5 reps with no UE support;  EC - 5 reps 3 sets with UE  support after stopping 1/2 jumping jacks 5 reps without UE support Lunges 5 reps with EO without UE support  Standing on blue mat - figure 8 with small medicine ball between legs - 180 degree turn 3 reps; then 360 degree turn 3 reps   Marching on blue mat - EO and EC - in place - with horizontal and vertical head turns      PT Long Term Goals - 03/02/20 2102      PT LONG TERM GOAL #1   Title  Pt will demo improved VOR function by testing WNL's for DVA with no c/o dizziness upon completion of test.    Baseline  1 line difference with c/o dizziness; met 02-16-20    Time  4    Period  Weeks    Status  Partially Met    Target Date  04/01/20      PT LONG TERM GOAL #2   Title  Pt will amb. 13' with horizontal head turns with dizziness rating </= 1/10 for incr. ease with environmental scanning.    Baseline  4/10 intensity on 02-16-20    Time  4    Period  Weeks    Status  Not Met  Target Date  04/01/20      PT LONG TERM GOAL #3   Title  Complete SOT and establish LTG as appropriate.                Improve vestibular input to >/= 50/100 for improved balance and mobility.    Time  6    Period  Weeks    Status  On-going    Target Date  04/01/20      PT LONG TERM GOAL #4   Title  Pt will subjectively report at least 75% improvement in dizziness, Rt shoulder pain, and RLE mobility/flexibility.    Baseline  40-50% improvement; Rt shoulder pain still occurs intermittently    Time  4    Period  Weeks    Status  On-going    Target Date  04/01/20      PT LONG TERM GOAL #5   Title  Independent in HEP for balance, vestibular and aquatic exercises.    Baseline  met 02-16-20    Time  4    Period  Weeks    Status  Achieved            Plan - 03/02/20 2052    Clinical Impression Statement  Pt able to maintain balance well with vestibular activities standing on compliant surfaces with head turns and with EC.  Pt reported increased headache with bouncing activities (on  minitrampoline) but able to continues with short frequent rest breaks.    Personal Factors and Comorbidities  Comorbidity 1;Time since onset of injury/illness/exacerbation;Fitness;Age    Comorbidities  asthma, migraines    Examination-Activity Limitations  Locomotion Level;Bed Mobility;Squat;Stand;Reach Overhead;Bend    Examination-Participation Restrictions  Cleaning;Meal Prep;Community Activity;Driving;Yard Work;Interpersonal Relationship;Laundry;Other   work   Stability/Clinical Decision Making  Stable/Uncomplicated    Rehab Potential  Good    PT Frequency  1x / week    PT Duration  6 weeks    PT Treatment/Interventions  ADLs/Self Care Home Management;Balance training;Vestibular;Neuromuscular re-education;Patient/family education;Aquatic Therapy;Therapeutic activities;Gait training;Therapeutic exercise    PT Next Visit Plan  cont balance and vestibular exercises    PT Home Exercise Plan  x1 viewing; balance on foam exs.    Consulted and Agree with Plan of Care  Patient       Patient will benefit from skilled therapeutic intervention in order to improve the following deficits and impairments:  Dizziness, Decreased balance, Decreased activity tolerance, Pain, Decreased strength, Difficulty walking  Visit Diagnosis: Unsteadiness on feet  Dizziness and giddiness     Problem List Patient Active Problem List   Diagnosis Date Noted  . Post concussion syndrome 12/17/2019  . History of asthma 12/17/2019    Alda Lea, PT 03/02/2020, 9:03 PM  [facility] [address] [place], [place], [address] Phone: [phone]   Fax:  [phone]  Name: Lindsey Duran MRN: [id number] Date of Birth: [birth year]

## 2020-03-07 ENCOUNTER — Other Ambulatory Visit: Payer: Self-pay

## 2020-03-07 ENCOUNTER — Ambulatory Visit: Payer: No Typology Code available for payment source | Admitting: Physical Therapy

## 2020-03-07 ENCOUNTER — Encounter: Payer: Self-pay | Admitting: Physical Therapy

## 2020-03-07 DIAGNOSIS — R2681 Unsteadiness on feet: Secondary | ICD-10-CM | POA: Diagnosis not present

## 2020-03-07 DIAGNOSIS — R42 Dizziness and giddiness: Secondary | ICD-10-CM

## 2020-03-07 DIAGNOSIS — R2689 Other abnormalities of gait and mobility: Secondary | ICD-10-CM

## 2020-03-07 NOTE — Therapy (Signed)
[facility] [address] [place], [place], [address] Phone: [phone]   Fax:  [phone]  Physical Therapy Treatment  Patient Details  Name: Lindsey Duran MRN: [id number] Date of Birth: 1990-10-28 Referring Provider (PT): [name], MD   Encounter Date: 03/07/2020  PT End of Session - 03/07/20 1806    Visit Number  8    Number of Visits  12    Date for PT Re-Evaluation  04/01/20   extended target date due to delay in initiating treatment due to no appt availability   Authorization Type  [employer] Golden Rule    PT Start Time  1330    PT Stop Time  1415    PT Time Calculation (min)  45 min    Activity Tolerance  Patient tolerated treatment well    Behavior During Therapy  [facility] for tasks assessed/performed       Past Medical History:  Diagnosis Date  . Anemia   . Asthma   . Migraines     Past Surgical History:  Procedure Laterality Date  . TONSILECTOMY, ADENOIDECTOMY, BILATERAL MYRINGOTOMY AND TUBES      There were no vitals filed for this visit.  Subjective Assessment - 03/07/20 1805    Subjective  Pt continues to report migraines especially after sessions.   pt states she is a little dizzy right now - feels a little "shaken"   Pertinent History  asthma, h/o migraines (pt denies migraines prior to accident)    Patient Stated Goals  "get my right side of my body congruent with my left side and feel confident again"    Currently in Pain?  Yes    Pain Score  4     Pain Location  Head    Pain Descriptors / Indicators  Headache    Pain Type  Chronic pain    Pain Onset  1 to 4 weeks ago    Pain Frequency  Intermittent       Aquatic therapy at [facility] - pool temp. 87.4 degrees  Patient seen for aquatic therapy today. Treatment took place in water 3.5-4 feet deep depending upon activity. Pt entered the pool via  ramp negotiation with use of hand rails with supervision.  Pt performed bil. Hamstring  and heel cord stretch - runner's stretch 30 sec hold x 1 rep; heel cord stretch with toes/forefoot on pool wall 30 sec hold x 1 rep  Pt performed water walking forwards, backwards and sideways 2 reps approx.11macross pool with cues for reciprocal arm movement; Had pt place UE's under water for increased UE resistance for strengthening.    Sideways stepping with squats 236m 2 reps   Pt performed jogging 2061m2 reps across pool with cues for reciprocal arm movement.  Brief rest break then repeated x 2 additional reps.  Jumping jacks x 10 reps x 2 sets with increased c/o HA/dizziness.  Braiding 1m21m reps with significant difficulty coordinating and balancing.  SLS x 10 sec x 3 reps each with significant LOB.  Standing with decreased BOS for head turns/nods x 10 reps each.  Decreased BOS with eyes closed x 10 sec x 2 reps then 30 sec x 2 reps with increased difficulty with increased time.  Pt reports difficulty with head nods more than head turns.  Ai Chi posture of soothing and enclosing x 15 reps each.  Pt reports increased relaxation and decreased HA with Ai Chi postures.  PT Long Term Goals - 03/02/20 2102      PT LONG TERM GOAL #1   Title  Pt will demo improved VOR function by testing WNL's for DVA with no c/o dizziness upon completion of test.    Baseline  1 line difference with c/o dizziness; met 02-16-20    Time  4    Period  Weeks    Status  Partially Met    Target Date  04/01/20      PT LONG TERM GOAL #2   Title  Pt will amb. 30' with horizontal head turns with dizziness rating </= 1/10 for incr. ease with environmental scanning.    Baseline  4/10 intensity on 02-16-20    Time  4    Period  Weeks    Status  Not Met    Target Date  04/01/20      PT LONG TERM GOAL #3   Title  Complete SOT and establish LTG as appropriate.                Improve vestibular input to >/= 50/100 for improved balance and mobility.    Time  6    Period  Weeks    Status  On-going     Target Date  04/01/20      PT LONG TERM GOAL #4   Title  Pt will subjectively report at least 75% improvement in dizziness, Rt shoulder pain, and RLE mobility/flexibility.    Baseline  40-50% improvement; Rt shoulder pain still occurs intermittently    Time  4    Period  Weeks    Status  On-going    Target Date  04/01/20      PT LONG TERM GOAL #5   Title  Independent in HEP for balance, vestibular and aquatic exercises.    Baseline  met 02-16-20    Time  4    Period  Weeks    Status  Achieved            Plan - 03/07/20 1807    Clinical Impression Statement  Pt reported increased HA with jumping activities and was challenged with high level balance activities in pool especially when with cognitive task of coordinating movement.  Pt motivated to decrease HA and dizziness.  Cont per POC.    Personal Factors and Comorbidities  Comorbidity 1;Time since onset of injury/illness/exacerbation;Fitness;Age    Comorbidities  asthma, migraines    Examination-Activity Limitations  Locomotion Level;Bed Mobility;Squat;Stand;Reach Overhead;Bend    Examination-Participation Restrictions  Cleaning;Meal Prep;Community Activity;Driving;Yard Work;Interpersonal Relationship;Laundry;Other   work   Stability/Clinical Decision Making  Stable/Uncomplicated    Rehab Potential  Good    PT Frequency  1x / week    PT Duration  6 weeks    PT Treatment/Interventions  ADLs/Self Care Home Management;Balance training;Vestibular;Neuromuscular re-education;Patient/family education;Aquatic Therapy;Therapeutic activities;Gait training;Therapeutic exercise    PT Next Visit Plan  cont balance and vestibular exercises    PT Home Exercise Plan  x1 viewing; balance on foam exs.    Consulted and Agree with Plan of Care  Patient       Patient will benefit from skilled therapeutic intervention in order to improve the following deficits and impairments:  Dizziness, Decreased balance, Decreased activity tolerance, Pain,  Decreased strength, Difficulty walking  Visit Diagnosis: Unsteadiness on feet  Dizziness and giddiness  Other abnormalities of gait and mobility     Problem List Patient Active Problem List   Diagnosis Date Noted  . Post concussion syndrome 12/17/2019  .  History of asthma 12/17/2019    Narda Bonds, PTA [place] 03/07/20 6:14 PM Phone: [phone] Fax: [facility] [place] [facility] [address] [place], [place], [address] Phone: [phone]   Fax:  [phone]  Name: Lindsey Duran MRN: [id number] Date of Birth: Aug 19, 1991

## 2020-03-08 ENCOUNTER — Ambulatory Visit: Payer: No Typology Code available for payment source | Admitting: Physical Therapy

## 2020-03-14 ENCOUNTER — Telehealth (INDEPENDENT_AMBULATORY_CARE_PROVIDER_SITE_OTHER): Payer: No Typology Code available for payment source | Admitting: Family Medicine

## 2020-03-14 ENCOUNTER — Encounter: Payer: Self-pay | Admitting: Family Medicine

## 2020-03-14 DIAGNOSIS — F0781 Postconcussional syndrome: Secondary | ICD-10-CM | POA: Diagnosis not present

## 2020-03-14 DIAGNOSIS — L659 Nonscarring hair loss, unspecified: Secondary | ICD-10-CM

## 2020-03-14 NOTE — Progress Notes (Signed)
Virtual Visit via Video Note  I connected with Lindsey Duran on 03/14/20 at  9:30 AM EDT by a video enabled telemedicine application 2/2 COVID-19 pandemic and verified that I am speaking with the correct person using two identifiers.  Location patient: home Location provider:work or home office Persons participating in the virtual visit: patient, provider  I discussed the limitations of evaluation and management by telemedicine and the availability of in person appointments. The patient expressed understanding and agreed to proceed.   HPI: Pt seen for f/u and review of labs.  Pt wanted to know if there was anything she needed to do to fix her labs.  Pt trying to eat breakfast q am to avoid hypoglycemia.  Glucose was 59 at time of labs on 02/11/20.  Pt notes an area in the middle of her scalp where the hair is breaking off.  In the past the area was present but seemed to improve after starting her dread locs.  Pt's loctician noticed the area is returning.  Pt denies pulling hair into a ponytail or using new products.  Pt taking a MVI.  At times the area itches and pt will use tea tree oil.  Pt started aquatic therapy for her concussion.  Still doing physical therapy for balance issues.  Pt states "it hurts to think".  Pt reports difficulty having to concentrate and process information.  Having this conversation is difficult.  Pt plans on taking a step back at work during the summer.  Initial injury occurred in [place] while on vacation 12/04/2019 when patient was hit in the head by a large piece of ice that slid off the roof.  No recent sports medicine follow-up visits.  ROS: See pertinent positives and negatives per HPI.  Past Medical History:  Diagnosis Date  . Anemia   . Asthma   . Migraines     Past Surgical History:  Procedure Laterality Date  . TONSILECTOMY, ADENOIDECTOMY, BILATERAL MYRINGOTOMY AND TUBES      Family History  Problem Relation Age of Onset  . Hypertension Paternal  Grandmother     SOCIAL HX: Patient is a Veterinary surgeon.   Current Outpatient Medications:  .  meclizine (ANTIVERT) 25 MG tablet, Take 1 tablet (25 mg total) by mouth 3 (three) times daily as needed for dizziness or nausea., Disp: 30 tablet, Rfl: 3 .  ondansetron (ZOFRAN ODT) 4 MG disintegrating tablet, Take 1 tablet (4 mg total) by mouth every 8 (eight) hours as needed for nausea or vomiting., Disp: 20 tablet, Rfl: 0 .  pantoprazole (PROTONIX) 20 MG tablet, Take 1 tablet (20 mg total) by mouth daily., Disp: 30 tablet, Rfl: 3 .  traZODone (DESYREL) 50 MG tablet, Take 1 tablet (50 mg total) by mouth at bedtime., Disp: 30 tablet, Rfl: 2  EXAM:  VITALS per patient if applicable: RR between 12-20 bpm  GENERAL: alert, oriented, slowed cognition appears, well and in no acute distress  HEENT: atraumatic, conjunctiva clear, no obvious abnormalities on inspection of external nose and ears  NECK: normal movements of the head and neck  LUNGS: on inspection no signs of respiratory distress, breathing rate appears normal, no obvious gross SOB, gasping or wheezing  CV: no obvious cyanosis  MS: moves all visible extremities without noticeable abnormality  PSYCH/NEURO: pleasant and cooperative, no obvious depression or anxiety, speech and thought processing grossly intact  ASSESSMENT AND PLAN:  Discussed the following assessment and plan:  Hair loss -Discussed possible causes including fungal infection, alopecia, thyroid dysfunction, vitamin  deficiency -Less likely thyroid dysfunction as TSH and free T4 on 02/11/2020 -Discussed follow-up with dermatology.  Given names of area providers.  Pt to look into, if needed will place referral.  Post concussion syndrome -Continue physical therapy and aquatics therapy -Given prolonged recovery discussed the importance of scheduling follow-up with sports med -Agree with stepping back from work.  If needed we will provide with letter.  Previous labs reviewed  in detail.  Questions answered to satisfaction.  Follow-up in 1 to 2 months, sooner if needed   I discussed the assessment and treatment plan with the patient. The patient was provided an opportunity to ask questions and all were answered. The patient agreed with the plan and demonstrated an understanding of the instructions.   The patient was advised to call back or seek an in-person evaluation if the symptoms worsen or if the condition fails to improve as anticipated.  I provided 18 minutes of non-face-to-face time during this encounter.   Billie Ruddy, MD

## 2020-03-15 ENCOUNTER — Encounter: Payer: No Typology Code available for payment source | Admitting: Physical Therapy

## 2020-03-22 ENCOUNTER — Other Ambulatory Visit: Payer: Self-pay

## 2020-03-22 ENCOUNTER — Encounter: Payer: Self-pay | Admitting: Physical Therapy

## 2020-03-22 ENCOUNTER — Ambulatory Visit: Payer: No Typology Code available for payment source | Attending: Family Medicine | Admitting: Physical Therapy

## 2020-03-22 DIAGNOSIS — R42 Dizziness and giddiness: Secondary | ICD-10-CM | POA: Diagnosis present

## 2020-03-22 DIAGNOSIS — R2681 Unsteadiness on feet: Secondary | ICD-10-CM | POA: Diagnosis present

## 2020-03-22 DIAGNOSIS — R2689 Other abnormalities of gait and mobility: Secondary | ICD-10-CM | POA: Diagnosis present

## 2020-03-23 NOTE — Therapy (Signed)
[facility] [address] [place], [place], [address] Phone: [phone]   Fax:  [phone]  Physical Therapy Treatment  Patient Details  Name: Lindsey Duran MRN: [id number] Date of Birth: 03/02/1991 Referring Provider (PT): [name], MD   Encounter Date: 03/22/2020  PT End of Session - 03/23/20 1326    Visit Number  9    Number of Visits  12    Date for PT Re-Evaluation  04/01/20   extended target date due to delay in initiating treatment due to no appt availability   Authorization Type  [employer] [place] [employer] Rule    PT Start Time  [phone]   pt arrived 3" late for appt   PT Stop Time  0932    PT Time Calculation (min)  33 min    Activity Tolerance  Patient tolerated treatment well    Behavior During Therapy  [facility] for tasks assessed/performed       Past Medical History:  Diagnosis Date   Anemia    Asthma    Migraines     Past Surgical History:  Procedure Laterality Date   TONSILECTOMY, ADENOIDECTOMY, BILATERAL MYRINGOTOMY AND TUBES      There were no vitals filed for this visit.  Subjective Assessment - 03/23/20 1317    Subjective  Pt states she had no headaches or migraines when she was at beach last weekend - came home last Thursday and had a migraine; asks if the difference in the barometric pressure could be a reason for this occurrence here at home but not at beach   pt states she is a little dizzy right now - feels a little "shaken"   Pertinent History  asthma, h/o migraines (pt denies migraines prior to accident)    Patient Stated Goals  "get my right side of my body congruent with my left side and feel confident again"    Currently in Pain?  Yes    Pain Score  2     Pain Location  Head    Pain Orientation  Right;Left    Pain Descriptors / Indicators  Headache    Pain Type  Chronic pain    Pain Onset  1 to 4 weeks ago    Pain Frequency  Intermittent    Aggravating Factors   no specific    Pain  Relieving Factors  Tylenol                               Balance Exercises - 03/23/20 1323      Balance Exercises: Standing   Rockerboard  Anterior/posterior;Head turns;EO;EC;Other reps (comment)   20 reps total - 10 reps each   Retro Gait  1 rep;Other (comment)   tossing ball   Turning  Both;Other reps (comment)   4 reps to each side - standing on blue mat   Other Standing Exercises  pt amb. making circles clockwise, counterclockwise, "V" and "+" patterns 30' x 2 reps each direction    Other Standing Exercises Comments  Pt performed jumping on mini trampoline 10 jumps EO, 10 jumps EC; 10  1/2 jumping jacks and 10 lunges             PT Long Term Goals - 03/22/20 0900      PT LONG TERM GOAL #1   Title  Pt will demo improved VOR function by testing WNL's for DVA with no c/o dizziness upon completion of  test.    Baseline  1 line difference with c/o dizziness; met 02-16-20    Time  4    Period  Weeks    Status  Partially Met      PT LONG TERM GOAL #2   Title  Pt will amb. 52' with horizontal head turns with dizziness rating </= 1/10 for incr. ease with environmental scanning.    Baseline  4/10 intensity on 02-16-20    Time  4    Period  Weeks    Status  Not Met      PT LONG TERM GOAL #3   Title  Complete SOT and establish LTG as appropriate.                Improve vestibular input to >/= 50/100 for improved balance and mobility.    Time  6    Period  Weeks    Status  On-going      PT LONG TERM GOAL #4   Title  Pt will subjectively report at least 75% improvement in dizziness, Rt shoulder pain, and RLE mobility/flexibility.    Baseline  40-50% improvement; Rt shoulder pain still occurs intermittently    Time  4    Period  Weeks    Status  On-going      PT LONG TERM GOAL #5   Title  Independent in HEP for balance, vestibular and aquatic exercises.    Baseline  met 02-16-20    Time  4    Period  Weeks    Status  Achieved            Plan  - 03/23/20 1331    Clinical Impression Statement  Pt reports the jumping activities on mini trampoline provoke the most dizziness; pt able to perform reaching up/down with standing on compliant surface with mild c/o dizziness - dizziness c/o is increased if turns are incorporated with this exercise.    Personal Factors and Comorbidities  Comorbidity 1;Time since onset of injury/illness/exacerbation;Fitness;Age    Comorbidities  asthma, migraines    Examination-Activity Limitations  Locomotion Level;Bed Mobility;Squat;Stand;Reach Overhead;Bend    Examination-Participation Restrictions  Cleaning;Meal Prep;Community Activity;Driving;Yard Work;Interpersonal Relationship;Laundry;Other   work   Stability/Clinical Decision Making  Stable/Uncomplicated    Rehab Potential  Good    PT Frequency  1x / week    PT Duration  6 weeks    PT Treatment/Interventions  ADLs/Self Care Home Management;Balance training;Vestibular;Neuromuscular re-education;Patient/family education;Aquatic Therapy;Therapeutic activities;Gait training;Therapeutic exercise    PT Next Visit Plan  cont balance and vestibular exercises    PT Home Exercise Plan  x1 viewing; balance on foam exs.    Consulted and Agree with Plan of Care  Patient       Patient will benefit from skilled therapeutic intervention in order to improve the following deficits and impairments:  Dizziness, Decreased balance, Decreased activity tolerance, Pain, Decreased strength, Difficulty walking  Visit Diagnosis: Unsteadiness on feet  Dizziness and giddiness     Problem List Patient Active Problem List   Diagnosis Date Noted   Post concussion syndrome 12/17/2019   History of asthma 12/17/2019    Alda Lea, PT 03/23/2020, 1:39 PM  [facility] [address] [place], [place], [address] Phone: [phone]   Fax:  [phone]  Name: Lindsey Duran MRN: [id number] Date of  Birth: [birth year]

## 2020-03-28 ENCOUNTER — Encounter: Payer: Self-pay | Admitting: Physical Therapy

## 2020-03-28 ENCOUNTER — Ambulatory Visit: Payer: No Typology Code available for payment source | Admitting: Physical Therapy

## 2020-03-28 ENCOUNTER — Other Ambulatory Visit: Payer: Self-pay

## 2020-03-28 DIAGNOSIS — R2681 Unsteadiness on feet: Secondary | ICD-10-CM

## 2020-03-28 DIAGNOSIS — R42 Dizziness and giddiness: Secondary | ICD-10-CM

## 2020-03-28 DIAGNOSIS — R2689 Other abnormalities of gait and mobility: Secondary | ICD-10-CM

## 2020-03-29 NOTE — Therapy (Signed)
[facility] [address] [place], [place], [address] Phone: [phone]   Fax:  [phone]  Physical Therapy Treatment  Patient Details  Name: Lindsey Duran MRN: [id number] Date of Birth: [birth year] Referring Provider (PT): [name], MD   Encounter Date: 03/28/2020  PT End of Session - 03/28/20 1858    Visit Number  10    Number of Visits  12    Date for PT Re-Evaluation  04/01/20   extended target date due to delay in initiating treatment due to no appt availability   Authorization Type  [employer] Golden Rule    PT Start Time  1420    PT Stop Time  1500    PT Time Calculation (min)  40 min    Activity Tolerance  Patient tolerated treatment well    Behavior During Therapy  [facility] for tasks assessed/performed       Past Medical History:  Diagnosis Date  . Anemia   . Asthma   . Migraines     Past Surgical History:  Procedure Laterality Date  . TONSILECTOMY, ADENOIDECTOMY, BILATERAL MYRINGOTOMY AND TUBES      There were no vitals filed for this visit.  Subjective Assessment - 03/28/20 1856    Subjective  Pt reporting increasing dizziness during session today.  Also reports having increased difficulty with concentration and high level activities cause her to have what she describes as "increased irritation and impatience".   pt states she is a little dizzy right now - feels a little "shaken"   Pertinent History  asthma, h/o migraines (pt denies migraines prior to accident)    Patient Stated Goals  "get my right side of my body congruent with my left side and feel confident again"    Currently in Pain?  No/denies       Aquatic therapy at [facility] - pool temp. 87.4 degrees  Patient seen for aquatic therapy today. Treatment took place in water 3.5-4 feet deep depending upon activity. Pt entered the pool via  ramp negotiation with use of hand rails with supervision.  Pt performed bil. Hamstring and heel cord  stretch - runner's stretch 30 sec hold x 1 rep; heel cord stretch with toes/forefoot on pool wall 30 sec hold x 1 rep  Pt performed water walking forwards, backwards and sideways 2 reps approx.64mcross pool with cues for reciprocal arm movement; Had pt place UE's under water for increased UE resistance for strengthening.    Sideways stepping with squats239m2 reps with UE bar bells for resistance  Pt performed jogging 2078m4 reps across pool with cues for reciprocal arm movement.  Brief rest break between each rep.  Jumping jacks x 10 reps x 2 sets with increased c/o HA/dizziness.  Braiding 73m31m reps with significant difficulty coordinating and balancing.  SLS x 10 sec x 3 reps each with significant LOB.  Decreased BOS with eyes closed x 10 sec x 2 sets.  Same position with eyes open and head turns/nods.  Ai Chi posture of soothing, enclosing and gathering x 15 reps each.  Pt reports increased relaxation and decreased HA with Ai Chi postures.  Pt reporting increased "irritation" with any activities that require increased concentration or coordination along with any activities that evoke dizziness/HA.    PT Long Term Goals - 03/22/20 0900      PT LONG TERM GOAL #1   Title  Pt will demo improved VOR function by testing WNL's for DVA  with no c/o dizziness upon completion of test.    Baseline  1 line difference with c/o dizziness; met 02-16-20    Time  4    Period  Weeks    Status  Partially Met      PT LONG TERM GOAL #2   Title  Pt will amb. 65' with horizontal head turns with dizziness rating </= 1/10 for incr. ease with environmental scanning.    Baseline  4/10 intensity on 02-16-20    Time  4    Period  Weeks    Status  Not Met      PT LONG TERM GOAL #3   Title  Complete SOT and establish LTG as appropriate.                Improve vestibular input to >/= 50/100 for improved balance and mobility.    Time  6    Period  Weeks    Status  On-going      PT LONG  TERM GOAL #4   Title  Pt will subjectively report at least 75% improvement in dizziness, Rt shoulder pain, and RLE mobility/flexibility.    Baseline  40-50% improvement; Rt shoulder pain still occurs intermittently    Time  4    Period  Weeks    Status  On-going      PT LONG TERM GOAL #5   Title  Independent in HEP for balance, vestibular and aquatic exercises.    Baseline  met 02-16-20    Time  4    Period  Weeks    Status  Achieved            Plan - 03/28/20 1859    Clinical Impression Statement  Pt continues to report dizziness increases the greatest with Jumping jacks along with sit<>stand from bench today.  Reporting having a hard time focusing and increased frustration/irritation at times along with decreased patience.  Cont PT per POC.    Personal Factors and Comorbidities  Comorbidity 1;Time since onset of injury/illness/exacerbation;Fitness;Age    Comorbidities  asthma, migraines    Examination-Activity Limitations  Locomotion Level;Bed Mobility;Squat;Stand;Reach Overhead;Bend    Examination-Participation Restrictions  Cleaning;Meal Prep;Community Activity;Driving;Yard Work;Interpersonal Relationship;Laundry;Other   work   Stability/Clinical Decision Making  Stable/Uncomplicated    Rehab Potential  Good    PT Frequency  1x / week    PT Duration  6 weeks    PT Treatment/Interventions  ADLs/Self Care Home Management;Balance training;Vestibular;Neuromuscular re-education;Patient/family education;Aquatic Therapy;Therapeutic activities;Gait training;Therapeutic exercise    PT Next Visit Plan  cont balance and vestibular exercises    PT Home Exercise Plan  x1 viewing; balance on foam exs.    Consulted and Agree with Plan of Care  Patient       Patient will benefit from skilled therapeutic intervention in order to improve the following deficits and impairments:  Dizziness, Decreased balance, Decreased activity tolerance, Pain, Decreased strength, Difficulty walking  Visit  Diagnosis: Unsteadiness on feet  Dizziness and giddiness  Other abnormalities of gait and mobility     Problem List Patient Active Problem List   Diagnosis Date Noted  . Post concussion syndrome 12/17/2019  . History of asthma 12/17/2019    Narda Bonds, PTA [place] 03/29/20 12:59 PM Phone: [phone] Fax: [facility] [address] [place], [place], [address] Phone: [phone]   Fax:  [phone]  Name: Coumba Kellison MRN: [id number] Date of Birth: 01/09/1991

## 2020-03-30 ENCOUNTER — Ambulatory Visit (INDEPENDENT_AMBULATORY_CARE_PROVIDER_SITE_OTHER): Payer: No Typology Code available for payment source | Admitting: Family Medicine

## 2020-03-30 ENCOUNTER — Other Ambulatory Visit: Payer: Self-pay

## 2020-03-30 ENCOUNTER — Encounter: Payer: Self-pay | Admitting: Family Medicine

## 2020-03-30 VITALS — BP 100/60 | HR 77 | Ht 68.0 in | Wt 155.6 lb

## 2020-03-30 DIAGNOSIS — F0781 Postconcussional syndrome: Secondary | ICD-10-CM

## 2020-03-30 DIAGNOSIS — R42 Dizziness and giddiness: Secondary | ICD-10-CM

## 2020-03-30 MED ORDER — DULOXETINE HCL 20 MG PO CPEP: ORAL_CAPSULE | ORAL | 0 refills | Status: DC

## 2020-03-30 NOTE — Patient Instructions (Signed)
Thank you for coming in today. Plan to start cymbalta.  Start at 20mg  (1 pills) at bedtime and increase to 2 pills after 1 week.  Recheck in 3-4 weeks.  Return sooner if needed.  Next up is ADHD medicine.   Let me know sooner if this is not working.   Duloxetine delayed-release capsules What is this medicine? DULOXETINE (doo LOX e teen) is used to treat depression, anxiety, and different types of chronic pain. This medicine may be used for other purposes; ask your health care provider or pharmacist if you have questions. COMMON BRAND NAME(S): Cymbalta, , [place] What should I tell my health care provider before I take this medicine? They need to know if you have any of these conditions:  bipolar disorder  glaucoma  high blood pressure  kidney disease  liver disease  seizures  suicidal thoughts, plans or attempt; a previous suicide attempt by you or a family member  take medicines that treat or prevent blood clots  taken medicines called MAOIs like Carbex, Eldepryl, Marplan, Nardil, and Parnate within 14 days  trouble passing urine  an unusual reaction to duloxetine, other medicines, foods, dyes, or preservatives  pregnant or trying to get pregnant  breast-feeding How should I use this medicine? Take this medicine by mouth with a glass of water. Follow the directions on the prescription label. Do not crush, cut or chew some capsules of this medicine. Some capsules may be opened and sprinkled on applesauce. Check with your doctor or pharmacist if you are not sure. You can take this medicine with or without food. Take your medicine at regular intervals. Do not take your medicine more often than directed. Do not stop taking this medicine suddenly except upon the advice of your doctor. Stopping this medicine too quickly may cause serious side effects or your condition may worsen. A special MedGuide will be given to you by the pharmacist with each prescription and refill.  Be sure to read this information carefully each time. Talk to your pediatrician regarding the use of this medicine in children. While this drug may be prescribed for children as young as 63 years of age for selected conditions, precautions do apply. Overdosage: If you think you have taken too much of this medicine contact a poison control center or emergency room at once. NOTE: This medicine is only for you. Do not share this medicine with others. What if I miss a dose? If you miss a dose, take it as soon as you can. If it is almost time for your next dose, take only that dose. Do not take double or extra doses. What may interact with this medicine? Do not take this medicine with any of the following medications:  desvenlafaxine  levomilnacipran  linezolid  MAOIs like Carbex, Eldepryl, Marplan, Nardil, and Parnate  methylene blue (injected into a vein)  milnacipran  thioridazine  venlafaxine This medicine may also interact with the following medications:  alcohol  amphetamines  aspirin and aspirin-like medicines  certain antibiotics like ciprofloxacin and enoxacin  certain medicines for blood pressure, heart disease, irregular heart beat  certain medicines for depression, anxiety, or psychotic disturbances  certain medicines for migraine headache like almotriptan, eletriptan, frovatriptan, naratriptan, rizatriptan, sumatriptan, zolmitriptan  certain medicines that treat or prevent blood clots like warfarin, enoxaparin, and dalteparin  cimetidine  fentanyl  lithium  NSAIDS, medicines for pain and inflammation, like ibuprofen or naproxen  phentermine  procarbazine  rasagiline  sibutramine  St. John's wort  theophylline  tramadol  tryptophan This list may not describe all possible interactions. Give your health care provider a list of all the medicines, herbs, non-prescription drugs, or dietary supplements you use. Also tell them if you smoke, drink  alcohol, or use illegal drugs. Some items may interact with your medicine. What should I watch for while using this medicine? Tell your doctor if your symptoms do not get better or if they get worse. Visit your doctor or healthcare provider for regular checks on your progress. Because it may take several weeks to see the full effects of this medicine, it is important to continue your treatment as prescribed by your doctor. This medicine may cause serious skin reactions. They can happen weeks to months after starting the medicine. Contact your healthcare provider right away if you notice fevers or flu-like symptoms with a rash. The rash may be red or purple and then turn into blisters or peeling of the skin. Or, you might notice a red rash with swelling of the face, lips, or lymph nodes in your neck or under your arms. Patients and their families should watch out for new or worsening thoughts of suicide or depression. Also watch out for sudden changes in feelings such as feeling anxious, agitated, panicky, irritable, hostile, aggressive, impulsive, severely restless, overly excited and hyperactive, or not being able to sleep. If this happens, especially at the beginning of treatment or after a change in dose, call your healthcare provider. You may get drowsy or dizzy. Do not drive, use machinery, or do anything that needs mental alertness until you know how this medicine affects you. Do not stand or sit up quickly, especially if you are an older patient. This reduces the risk of dizzy or fainting spells. Alcohol may interfere with the effect of this medicine. Avoid alcoholic drinks. This medicine can cause an increase in blood pressure. This medicine can also cause a sudden drop in your blood pressure, which may make you feel faint and increase the chance of a fall. These effects are most common when you first start the medicine or when the dose is increased, or during use of other medicines that can cause a  sudden drop in blood pressure. Check with your doctor for instructions on monitoring your blood pressure while taking this medicine. Your mouth may get dry. Chewing sugarless gum or sucking hard candy, and drinking plenty of water, may help. Contact your doctor if the problem does not go away or is severe. What side effects may I notice from receiving this medicine? Side effects that you should report to your doctor or health care professional as soon as possible:  allergic reactions like skin rash, itching or hives, swelling of the face, lips, or tongue  anxious  breathing problems  confusion  changes in vision  chest pain  confusion  elevated mood, decreased need for sleep, racing thoughts, impulsive behavior  eye pain  fast, irregular heartbeat  feeling faint or lightheaded, falls  feeling agitated, angry, or irritable  hallucination, loss of contact with reality  high blood pressure  loss of balance or coordination  palpitations  redness, blistering, peeling or loosening of the skin, including inside the mouth  restlessness, pacing, inability to keep still  seizures  stiff muscles  suicidal thoughts or other mood changes  trouble passing urine or change in the amount of urine  trouble sleeping  unusual bleeding or bruising  unusually weak or tired  vomiting  yellowing of the eyes or skin Side effects  that usually do not require medical attention (report to your doctor or health care professional if they continue or are bothersome):  change in sex drive or performance  change in appetite or weight  constipation  dizziness  dry mouth  headache  increased sweating  nausea  tired This list may not describe all possible side effects. Call your doctor for medical advice about side effects. You may report side effects to FDA at 1-800-FDA-1088. Where should I keep my medicine? Keep out of the reach of children. Store at room temperature  between 15 and 30 degrees C (59 to 86 degrees F). Throw away any unused medicine after the expiration date. NOTE: This sheet is a summary. It may not cover all possible information. If you have questions about this medicine, talk to your doctor, pharmacist, or health care provider.  2020 Elsevier/Gold Standard (2019-01-08 13:47:50)

## 2020-03-30 NOTE — Progress Notes (Signed)
Subjective:    Chief Complaint: Lindsey Duran, LAT, ATC, am serving as scribe for Dr. [name].  Lindsey Duran,  is a 29 y.o. female who presents for f/u of concussion that she sustained on 12/04/19 when a piece of snow/ice slid off a roof and hit her on the head.  She was last seen by Dr. Georgina Snell on 12/21/19 and c/o  Photophobia, irritability, mood swings and difficulty focusing.  She was prescribed Trazadone and was referred to PT, having completed 10 visits.  Since her last visit, pt reports that her symptoms are dependent on what she's doing.  She reports more dizziness if she's moving too fast or moving from down to up too quickly.  She con't to have issues of HA and nausea.  She con't to have difficulty w/ irritability and w/ concentration/focus.   Injury date : 12/04/19 Visit #: 3   History of Present Illness:    Concussion Self-Reported Symptom Score Symptoms rated on a scale 1-6, in last 24 hours   Headache: 4    Nausea: 2  Dizziness: 4  Vomiting: 0  Balance Difficulty: 4   Trouble Falling Asleep: 0   Fatigue: 2  Sleep Less Than Usual: 0  Daytime Drowsiness: 0  Sleep More Than Usual: 0  Photophobia: 2  Phonophobia: 5  Irritability: 6  Sadness: 1  Numbness or Tingling: 0  Nervousness: 3  Feeling More Emotional: 6  Feeling Mentally Foggy: 6  Feeling Slowed Down: 4  Memory Problems: 3  Difficulty Concentrating: 5  Visual Problems: 0   Total # of Symptoms: 15/22 Total Symptom Score: 57/132 Previous Total # of Symptoms: 18/22 Previous Symptom Score: 93/132   Neck Pain: No  Tinnitus: Yes - intermittent  Review of Systems: No fevers or chills   Review of History: Asthma  Objective:    Physical Examination Vitals:   03/30/20 0922  BP: 100/60  Pulse: 77  SpO2: 97%   MSK: Normal cervical motion. Neuro: Alert and oriented normal coordination gait. Psych: Speech thought process and affect.   Assessment and Plan   29 y.o. female with  postconcussion syndrome.  Concussion was 4 months ago.  Patient continues to have significant symptoms in multiple domains including dizziness headache and irritability.  Trazodone has not been all that helpful.  We will try Cymbalta as this may be helpful with mood as well as with pain.  Start at 20 mg and titrate to 40 mg.  Recheck in about a month.  Return sooner if needed.  Will consider ADHD type medications in the future if needed given difficulty with concentration and memory.      Action/Discussion: Reviewed diagnosis, management options, expected outcomes, and the reasons for scheduled and emergent follow-up. Questions were adequately answered. Patient expressed verbal understanding and agreement with the following plan.     Patient Education:  Reviewed with patient the risks (i.e, a repeat concussion, post-concussion syndrome, second-impact syndrome) of returning to play prior to complete resolution, and thoroughly reviewed the signs and symptoms of concussion.Reviewed need for complete resolution of all symptoms, with rest AND exertion, prior to return to play.  Reviewed red flags for urgent medical evaluation: worsening symptoms, nausea/vomiting, intractable headache, musculoskeletal changes, focal neurological deficits.  [facility] Concussion Care Plan, which clearly outlines the plans stated above, was given to patient.   In addition to the time spent performing tests, I spent 30 min   Reviewed with patient the risks (i.e, a repeat concussion, post-concussion  syndrome, second-impact syndrome) of returning to play prior to complete resolution, and thoroughly reviewed the signs and symptoms of      concussion. Reviewedf need for complete resolution of all symptoms, with rest AND exertion, prior to return to play.  Reviewed red flags for urgent medical evaluation: worsening symptoms, nausea/vomiting, intractable headache, musculoskeletal changes, focal neurological  deficits.  [facility] Concussion Care Plan, which clearly outlines the plans stated above, was given to patient   After Visit Summary printed out and provided to patient as appropriate.  The above documentation has been reviewed and is accurate and complete Clementeen Graham

## 2020-04-04 ENCOUNTER — Ambulatory Visit: Payer: No Typology Code available for payment source | Admitting: Physical Therapy

## 2020-04-04 ENCOUNTER — Encounter: Payer: Self-pay | Admitting: Physical Therapy

## 2020-04-04 ENCOUNTER — Other Ambulatory Visit: Payer: Self-pay

## 2020-04-04 DIAGNOSIS — R2689 Other abnormalities of gait and mobility: Secondary | ICD-10-CM

## 2020-04-04 DIAGNOSIS — R2681 Unsteadiness on feet: Secondary | ICD-10-CM

## 2020-04-04 DIAGNOSIS — R42 Dizziness and giddiness: Secondary | ICD-10-CM

## 2020-04-05 NOTE — Therapy (Signed)
[facility] [address] [place], [place], [address] Phone: [phone]   Fax:  [phone]  Physical Therapy Treatment  Patient Details  Name: Lindsey Duran MRN: [id number] Date of Birth: December 17, 1990 Referring Provider (PT): [name], MD   Encounter Date: 04/04/2020   PT End of Session - 04/04/20 1656    Visit Number 11    Number of Visits 12    Date for PT Re-Evaluation 04/01/20   extended target date due to delay in initiating treatment due to no appt availability   Authorization Type [employer]    PT Start Time 1335    PT Stop Time 1415    PT Time Calculation (min) 40 min    Equipment Utilized During Treatment Other (comment)   hand buoyancy bar bells   Activity Tolerance Patient tolerated treatment well    Behavior During Therapy [facility] for tasks assessed/performed           Past Medical History:  Diagnosis Date  . Anemia   . Asthma   . Migraines     Past Surgical History:  Procedure Laterality Date  . TONSILECTOMY, ADENOIDECTOMY, BILATERAL MYRINGOTOMY AND TUBES      There were no vitals filed for this visit.   Subjective Assessment - 04/04/20 1654    Subjective MD put her on Cymbalta for "irritation/mood".  Pt states she having dizziness and headache today.   pt states she is a little dizzy right now - feels a little "shaken"   Pertinent History asthma, h/o migraines (pt denies migraines prior to accident)    Patient Stated Goals "get my right side of my body congruent with my left side and feel confident again"    Currently in Pain? Yes    Pain Score 3     Pain Location Head    Pain Descriptors / Indicators Headache    Pain Type Chronic pain    Pain Onset More than a month ago    Pain Frequency Intermittent    Aggravating Factors  increased activities           Aquatic therapy at [facility] - pool temp. 87.4 degrees  Patient seen for aquatic therapy today. Treatment took place in  water 4 feet deep depending upon activity. Pt entered/exited the pool via ladder at pool edge with supervision.  Pt performed bil. Hamstring and heel cord stretch - runner's stretch 30 sec hold x 1 rep; heel cord stretch with toes/forefoot on pool wall 30 sec hold x 1 rep  Pt performed water walking forwards, backwards and sideways x75mx 2 reps of each; Had pt place UE's under water for increased UE resistance for strengthening.  Sideways stepping with squats263m2 reps with UE bar bells for resistance  Pt performed jogging2574m reps across pool with cues for reciprocal arm movement. Brief rest break between each rep.  Jumping jacks x 10 reps x 2 sets with increased c/o HA/dizziness.  At pool edge for bil LE marching, hip extension, hip abd, hip add and march into hip extension x 15 reps alternating sides.  Pt did require 1 UE support during most exercises for balance and difficulty coordinating at times.  Ai Chi posture of soothing, enclosing and gathering x 15 reps each.  Difficulty coordinating movements and needs verbal and visual cues for technique and to maintain sequence.  Pt reports increased relaxation and decreased HA with Ai Chi postures.  Pt reporting increased "irritation" with any activities that  require increased concentration or coordination along with any activities that evoke dizziness/HA.     PT Long Term Goals - 03/22/20 0900      PT LONG TERM GOAL #1   Title Pt will demo improved VOR function by testing WNL's for DVA with no c/o dizziness upon completion of test.    Baseline 1 line difference with c/o dizziness; met 02-16-20    Time 4    Period Weeks    Status Partially Met      PT LONG TERM GOAL #2   Title Pt will amb. 58' with horizontal head turns with dizziness rating </= 1/10 for incr. ease with environmental scanning.    Baseline 4/10 intensity on 02-16-20    Time 4    Period Weeks    Status Not Met      PT LONG TERM GOAL #3   Title  Complete SOT and establish LTG as appropriate.                Improve vestibular input to >/= 50/100 for improved balance and mobility.    Time 6    Period Weeks    Status On-going      PT LONG TERM GOAL #4   Title Pt will subjectively report at least 75% improvement in dizziness, Rt shoulder pain, and RLE mobility/flexibility.    Baseline 40-50% improvement; Rt shoulder pain still occurs intermittently    Time 4    Period Weeks    Status On-going      PT LONG TERM GOAL #5   Title Independent in HEP for balance, vestibular and aquatic exercises.    Baseline met 02-16-20    Time 4    Period Weeks    Status Achieved                 Plan - 04/04/20 1657    Clinical Impression Statement Pt reporting increased dizziness and HA today before and with session.  Continues to "push through" session despite this.  Pylometrics continue to increase symptoms.  Discussed with Guido Sander, PT, who plans to check goals at next visit and assess for cont PT vs d/c.    Personal Factors and Comorbidities Comorbidity 1;Time since onset of injury/illness/exacerbation;Fitness;Age    Comorbidities asthma, migraines    Examination-Activity Limitations Locomotion Level;Bed Mobility;Squat;Stand;Reach Overhead;Bend;Caring for Others    Examination-Participation Restrictions Cleaning;Meal Prep;Community Activity;Driving;Yard Work;Interpersonal Relationship;Laundry;Other   work   Stability/Clinical Decision Making Stable/Uncomplicated    Rehab Potential Good    PT Frequency 1x / week    PT Duration 6 weeks    PT Treatment/Interventions ADLs/Self Care Home Management;Balance training;Vestibular;Neuromuscular re-education;Patient/family education;Aquatic Therapy;Therapeutic activities;Gait training;Therapeutic exercise    PT Next Visit Plan Vinnie Level OFHQRF, PT, to check goals and assess for cont PT vs d/c    PT Home Exercise Plan x1 viewing; balance on foam exs.    Consulted and Agree with Plan of Care  Patient           Patient will benefit from skilled therapeutic intervention in order to improve the following deficits and impairments:  Dizziness, Decreased balance, Decreased activity tolerance, Pain, Decreased strength, Difficulty walking  Visit Diagnosis: Unsteadiness on feet  Dizziness and giddiness  Other abnormalities of gait and mobility     Problem List Patient Active Problem List   Diagnosis Date Noted  . Post concussion syndrome 12/17/2019  . History of asthma 12/17/2019   Narda Bonds, PTA [place] 04/05/20 12:13 PM Phone: [phone]  Fax: [facility] [address] [place], [place], [address] Phone: [phone]   Fax:  [phone]  Name: Lindsey Duran MRN: [id number] Date of Birth: 05-Nov-1990

## 2020-04-12 ENCOUNTER — Ambulatory Visit: Payer: No Typology Code available for payment source | Admitting: Physical Therapy

## 2020-04-12 ENCOUNTER — Other Ambulatory Visit: Payer: Self-pay

## 2020-04-12 DIAGNOSIS — R2681 Unsteadiness on feet: Secondary | ICD-10-CM | POA: Diagnosis not present

## 2020-04-12 DIAGNOSIS — R2689 Other abnormalities of gait and mobility: Secondary | ICD-10-CM

## 2020-04-12 DIAGNOSIS — R42 Dizziness and giddiness: Secondary | ICD-10-CM

## 2020-04-13 NOTE — Therapy (Signed)
[facility] [address] [place], [place], [address] Phone: [phone]   Fax:  [phone]  Physical Therapy Treatment  Patient Details  Name: Lindsey Duran MRN: [id number] Date of Birth: 07-Jul-1991 Referring Provider (PT): [name], MD   Encounter Date: 04/12/2020   PT End of Session - 04/13/20 1123    Visit Number 12    Number of Visits 20    Date for PT Re-Evaluation 05/20/20   extended target date due to delay in initiating treatment due to no appt availability   Authorization Type [employer] Rule    PT Start Time [phone]    PT Stop Time 0800    PT Time Calculation (min) 44 min    Equipment Utilized During Treatment Other (comment)   hand buoyancy bar bells   Activity Tolerance Patient tolerated treatment well    Behavior During Therapy [facility] for tasks assessed/performed           Past Medical History:  Diagnosis Date  . Anemia   . Asthma   . Migraines     Past Surgical History:  Procedure Laterality Date  . TONSILECTOMY, ADENOIDECTOMY, BILATERAL MYRINGOTOMY AND TUBES      There were no vitals filed for this visit.   Subjective Assessment - 04/12/20 0720    Subjective Pt states she was a little dizzy when she got out of car after arriving at the center just now; states she continues to have difficulty focusing which leads to irritability - reports she is hoping the Cymbalta prescribed by MD will help (has been taking it about a week)    Patient Stated Goals "get my right side of my body congruent with my left side and feel confident again"    Currently in Pain? Yes    Pain Score 2     Pain Location Head    Pain Orientation Right    Pain Descriptors / Indicators Headache    Pain Type Chronic pain          Sensory Organization Test - composite score 41/100 with N=70/100  Somatosensory WNL's Visual input decr. With score 30/100 with N= 73/100 Vestibular input slightly decreased at 53/100  with N= 55/100   Condition 1 = trial 1 WNL's; trials 2 & 3 below N Condition 2 = all 3 trials below N Condition 3 = all 3 trials below N Condition 4 = FALL on trial 1:  Trials 2 & 3 below N Condition 5 = all 3 trials below N Condition 6 = FALL on trial 1: trial 2 below N and trial 3 WNL's   Pt amb. 25' with horizontal head turns with c/o dizziness 2/10 intensity           Vestibular Treatment/Exercise - 04/13/20 0001      Vestibular Treatment/Exercise   Gaze Exercises X1 Viewing Horizontal;X1 Viewing Vertical      X1 Viewing Horizontal   Foot Position bil. stance    Time --   60 secs plain; 30 secs patterned background   Reps 1    Comments pt needed cues to perform correctly       X1 Viewing Vertical   Foot Position bil. stance    Time --   60 sec plain background; 30 secs patterned   Reps 1    Comments cues to perform correctly                  PT Education - 04/13/20 1123  Education Details progressed x1 viewing exercise to patterned background    Person(s) Educated Patient    Methods Explanation;Demonstration    Comprehension Verbalized understanding;Returned demonstration               PT Long Term Goals - 04/12/20 6440      PT LONG TERM GOAL #1   Title Pt will demo improved VOR function by testing WNL's for DVA with no c/o dizziness upon completion of test.    Baseline 1 line difference with c/o dizziness; met 02-16-20; met on 04-12-20 with initially a 3 line difference, but pt able to read 2nd line above baseline with continued focus & concentration    Time 4    Period Weeks    Status Achieved      PT LONG TERM GOAL #2   Title Pt will amb. 46' with horizontal head turns with dizziness rating </= 1/10 for incr. ease with environmental scanning.    Baseline 4/10 intensity on 02-16-20; 2/10 intensity rating on 04-13-20    Time 4    Period Weeks    Status On-going    Target Date 05/20/20      PT LONG TERM GOAL #3   Title Complete SOT and  establish LTG as appropriate.                Improve vestibular input to >/= 50/100 for improved balance and mobility.    Baseline met on 04-12-20 for vestibular - visual input is now decreased    Time 6    Period Weeks    Status Achieved      PT LONG TERM GOAL #4   Title Pt will subjectively report at least 75% improvement in dizziness, Rt shoulder pain, and RLE mobility/flexibility.    Baseline 40-50% improvement; Rt shoulder pain still occurs intermittently - same on 04-12-20 per pt report    Time 4    Period Weeks    Status On-going    Target Date 05/20/20      PT LONG TERM GOAL #5   Title Independent in HEP for balance, vestibular and aquatic exercises.    Baseline aquatic exercises to be added to HEP - 04-12-20    Time 4    Period Weeks    Status On-going      Additional Long Term Goals   Additional Long Term Goals Yes      PT LONG TERM GOAL #6   Title Increase visual input on SOT to WNL's for improved balance and gaze stabilization; increase composite score to >/= 65/100 to demo improved balance.    Baseline visual input score 30/100; composite score 41/100 on 04-12-20    Time 4    Period Weeks    Status New    Target Date 05/20/20                 Plan - 04/13/20 1133    Clinical Impression Statement Pt has met LTG's #1, 3 and 5:  LTG #2 partially met as pt able to amb. 24' with head turns but dizziness rated 2/10 intensity with this activity.  LTG #4 is ongoing as pt reports improvement in dizziness but not 75% as stated goal.  Pt's SOT score 41/100 is below N of 70/100; pt's score today is not as good as it was when tested previously in mid April 2021.  Will continue to address dizziness and balance deficits with additional 4 weeks of PT.    Personal Factors and Comorbidities  Comorbidity 1;Time since onset of injury/illness/exacerbation;Fitness;Age    Comorbidities asthma, migraines    Examination-Activity Limitations Locomotion Level;Bed Mobility;Squat;Stand;Reach  Overhead;Bend;Caring for Others    Examination-Participation Restrictions Cleaning;Meal Prep;Community Activity;Driving;Yard Work;Interpersonal Relationship;Laundry;Other   work   Stability/Clinical Decision Making Stable/Uncomplicated    Rehab Potential Good    PT Frequency 2x / week    PT Duration 4 weeks    PT Treatment/Interventions ADLs/Self Care Home Management;Balance training;Vestibular;Neuromuscular re-education;Patient/family education;Aquatic Therapy;Therapeutic activities;Gait training;Therapeutic exercise    PT Next Visit Plan pt to cont with land PT 1x/week & aquatic PT for 2 visits for total of 4 weeks    PT Home Exercise Plan x1 viewing; balance on foam exs.    Consulted and Agree with Plan of Care Patient           Patient will benefit from skilled therapeutic intervention in order to improve the following deficits and impairments:  Dizziness, Decreased balance, Decreased activity tolerance, Pain, Decreased strength, Difficulty walking  Visit Diagnosis: Unsteadiness on feet - Plan: PT plan of care cert/re-cert  Dizziness and giddiness - Plan: PT plan of care cert/re-cert  Other abnormalities of gait and mobility - Plan: PT plan of care cert/re-cert     Problem List Patient Active Problem List   Diagnosis Date Noted  . Post concussion syndrome 12/17/2019  . History of asthma 12/17/2019    Alda Lea, PT 04/13/2020, 11:48 AM  [facility] [address] [place], [place], [address] Phone: [phone]   Fax:  [phone]  Name: Lindsey Duran MRN: [id number] Date of Birth: 1990/12/06

## 2020-04-19 ENCOUNTER — Other Ambulatory Visit: Payer: Self-pay

## 2020-04-19 ENCOUNTER — Ambulatory Visit: Payer: No Typology Code available for payment source | Admitting: Physical Therapy

## 2020-04-19 DIAGNOSIS — R42 Dizziness and giddiness: Secondary | ICD-10-CM

## 2020-04-19 DIAGNOSIS — R2681 Unsteadiness on feet: Secondary | ICD-10-CM

## 2020-04-20 ENCOUNTER — Ambulatory Visit (INDEPENDENT_AMBULATORY_CARE_PROVIDER_SITE_OTHER): Payer: No Typology Code available for payment source | Admitting: Family Medicine

## 2020-04-20 ENCOUNTER — Encounter: Payer: Self-pay | Admitting: Family Medicine

## 2020-04-20 VITALS — BP 110/78 | HR 62 | Ht 68.0 in | Wt 154.4 lb

## 2020-04-20 DIAGNOSIS — F0781 Postconcussional syndrome: Secondary | ICD-10-CM

## 2020-04-20 MED ORDER — DULOXETINE HCL 30 MG PO CPEP: 30.0000 mg | ORAL_CAPSULE | Freq: Every day | ORAL | 1 refills | Status: DC

## 2020-04-20 MED ORDER — MECLIZINE HCL 25 MG PO TABS: 25.0000 mg | ORAL_TABLET | Freq: Three times a day (TID) | ORAL | 3 refills | Status: DC | PRN

## 2020-04-20 NOTE — Therapy (Signed)
[facility] [address] [place], [place], [address] Phone: [phone]   Fax:  [phone]  Physical Therapy Treatment  Patient Details  Name: Lindsey Duran MRN: [id number] Date of Birth: 1991-01-17 Referring Provider (PT): [name], MD   Encounter Date: 04/19/2020   PT End of Session - 04/20/20 3846    Visit Number 13    Number of Visits 20    Date for PT Re-Evaluation 05/20/20   extended target date due to delay in initiating treatment due to no appt availability   Authorization Type [employer]    PT Start Time [phone]    PT Stop Time 0801    PT Time Calculation (min) 43 min    Equipment Utilized During Treatment Other (comment)   hand buoyancy bar bells   Activity Tolerance Patient tolerated treatment well    Behavior During Therapy [facility] for tasks assessed/performed           Past Medical History:  Diagnosis Date  . Anemia   . Asthma   . Migraines     Past Surgical History:  Procedure Laterality Date  . TONSILECTOMY, ADENOIDECTOMY, BILATERAL MYRINGOTOMY AND TUBES      There were no vitals filed for this visit.   Subjective Assessment - 04/20/20 0622    Subjective Pt states she is a little dizzy this am, after having driven to clinic and getting out of car - no changes otherwise; offered pt aquatic appt on Wed., 6-30 - pt states she won't be able to make that    Patient Stated Goals "get my right side of my body congruent with my left side and feel confident again"    Currently in Pain? Yes    Pain Score 2     Pain Location Head    Pain Orientation Right    Pain Descriptors / Indicators Headache    Pain Type Chronic pain                              Vestibular Treatment/Exercise - 04/20/20 0001      Vestibular Treatment/Exercise   Gaze Exercises X1 Viewing Horizontal;X1 Viewing Vertical      X1 Viewing Horizontal   Foot Position bil. stance - standing     Time  --   30 secs patterned   Reps 1      X1 Viewing Vertical   Foot Position bil. stance - standing    Time --   30 secs patterned   Reps 1              Balance Exercises - 04/20/20 0001      Balance Exercises: Standing   Standing Eyes Opened Narrow base of support (BOS);Head turns;Foam/compliant surface;5 reps    Standing Eyes Closed Narrow base of support (BOS);Wide (BOA);Head turns;Foam/compliant surface;5 reps   on blue mat    Turning Right;Left;Other reps (comment)   4 reps - 2 to each side   Other Standing Exercises pt amb. making circles clockwise, counterclockwise, "V" and "+" patterns 30' x 2 reps each direction    Other Standing Exercises Comments marching on mat with head turns - EO - in place and then marched forward and backward 3 reps on the blue mat for compliant surface training          Pt performed reaching/habituation activity standing on blue mat - diagonal pattern reaching down toward 1 foot then  standing up and turning with reaching to opposite side "X" pattern 5 reps each side  Performed fig. 8 activity on blue mat - passing medicine ball between legs 1 rep, returning to upright, turning 180 degrees for 5 reps; then progressed to turning 360 degrees after doing fig. 8 with ball for 3 reps - pt needed short rest period as this provoked dizziness  Standing on [employer] - squats 3 reps EO and then 5 reps with EC  Marching in place on mat - pt performed visual tracking of ball in 1/2 circle pattern  Performed 40' amb. With making "s" pattern with ball in addition to the patterns listed above - needed short rest period as this provoked increased dizziness        PT Long Term Goals - 04/20/20 2355      PT LONG TERM GOAL #1   Title Pt will demo improved VOR function by testing WNL's for DVA with no c/o dizziness upon completion of test.    Baseline 1 line difference with c/o dizziness; met 02-16-20; met on 04-12-20 with initially a 3 line difference, but pt able to  read 2nd line above baseline with continued focus & concentration    Time 4    Period Weeks    Status Achieved      PT LONG TERM GOAL #2   Title Pt will amb. 61' with horizontal head turns with dizziness rating </= 1/10 for incr. ease with environmental scanning.    Baseline 4/10 intensity on 02-16-20; 2/10 intensity rating on 04-13-20    Time 4    Period Weeks    Status On-going      PT LONG TERM GOAL #3   Title Complete SOT and establish LTG as appropriate.                Improve vestibular input to >/= 50/100 for improved balance and mobility.    Baseline met on 04-12-20 for vestibular - visual input is now decreased    Time 6    Period Weeks    Status Achieved      PT LONG TERM GOAL #4   Title Pt will subjectively report at least 75% improvement in dizziness, Rt shoulder pain, and RLE mobility/flexibility.    Baseline 40-50% improvement; Rt shoulder pain still occurs intermittently - same on 04-12-20 per pt report    Time 4    Period Weeks    Status On-going      PT LONG TERM GOAL #5   Title Independent in HEP for balance, vestibular and aquatic exercises.    Baseline aquatic exercises to be added to HEP - 04-12-20    Time 4    Period Weeks    Status On-going      PT LONG TERM GOAL #6   Title Increase visual input on SOT to WNL's for improved balance and gaze stabilization; increase composite score to >/= 65/100 to demo improved balance.    Baseline visual input score 30/100; composite score 41/100 on 04-12-20    Time 4    Period Weeks    Status New                 Plan - 04/20/20 7322    Clinical Impression Statement Pt continues to c/o dizziness which is provoked with activities requring visual attention and tracking combined with another task such as static marching or with ambulation.  Pt expresses frustration with difficulty required with concentration for completing the task.  No major LOB occurred with any of the activities.  Jogging (bouncing) provokes much  dizziness /frustration which leads to irritability.    Personal Factors and Comorbidities Comorbidity 1;Time since onset of injury/illness/exacerbation;Fitness;Age    Comorbidities asthma, migraines    Examination-Activity Limitations Locomotion Level;Bed Mobility;Squat;Stand;Reach Overhead;Bend;Caring for Others    Examination-Participation Restrictions Cleaning;Meal Prep;Community Activity;Driving;Yard Work;Interpersonal Relationship;Laundry;Other   work   Stability/Clinical Decision Making Stable/Uncomplicated    Rehab Potential Good    PT Frequency 2x / week    PT Duration 4 weeks    PT Treatment/Interventions ADLs/Self Care Home Management;Balance training;Vestibular;Neuromuscular re-education;Patient/family education;Aquatic Therapy;Therapeutic activities;Gait training;Therapeutic exercise    PT Next Visit Plan cont vestibular, gaze exercises    PT Home Exercise Plan x1 viewing; balance on foam exs.    Consulted and Agree with Plan of Care Patient           Patient will benefit from skilled therapeutic intervention in order to improve the following deficits and impairments:  Dizziness, Decreased balance, Decreased activity tolerance, Pain, Decreased strength, Difficulty walking  Visit Diagnosis: Dizziness and giddiness  Unsteadiness on feet     Problem List Patient Active Problem List   Diagnosis Date Noted  . Post concussion syndrome 12/17/2019  . History of asthma 12/17/2019    Alda Lea, PT 04/20/2020, 6:45 AM  [facility] [address] [place], [place], [address] Phone: [phone]   Fax:  [phone]  Name: Lindsey Duran MRN: [id number] Date of Birth: August 29, 1991

## 2020-04-20 NOTE — Progress Notes (Signed)
Subjective:    Chief Complaint: Lindsey Duran, LAT, ATC, am serving as scribe for Dr. Clementeen Graham.  Lindsey Duran,  is a 29 y.o. female who presents for f/u of concussion that she sustained on 12/04/19 when a piece of snow/ice slid off a roof and hit her on the head.  She was last seen by Dr. Denyse Amass on 03/30/20 and c/o HA, nausea, dizziness, irritability and difficulty concentrating/focusing.  She was prescribed Cymbalta and has completed 2 PT sessions.  Since her last visit, pt states that the Cymbalta has helped w/ her irritability.  She con't to have issues w/ mood swings and difficulty concentrating focusing.  She notes that her HA has lessened but con't to have issues w/ dizziness and nausea.  She also con't to have issues w/ moving too quickly.  Injury date : 12/04/19 Visit #: 4   History of Present Illness:    Concussion Self-Reported Symptom Score Symptoms rated on a scale 1-6, in last 24 hours   Headache: 0    Nausea: 6  Dizziness: 6  Vomiting: 0  Balance Difficulty: 5   Trouble Falling Asleep: 0   Fatigue: 1  Sleep Less Than Usual: 0  Daytime Drowsiness: 2  Sleep More Than Usual: 0  Photophobia: 1  Phonophobia: 1  Irritability: 3  Sadness: 3  Numbness or Tingling: 0  Nervousness: 3  Feeling More Emotional: 3  Feeling Mentally Foggy: 5  Feeling Slowed Down: 5  Memory Problems: 3  Difficulty Concentrating: 4  Visual Problems: 0   Total # of Symptoms: 15/22 Total Symptom Score: 51/132 Previous Total # of Symptoms: 18/22 Previous Symptom Score: 93/132   Neck Pain: No  Tinnitus: No  Review of Systems: No fevers or chills    Review of History: History of migraines and asthma.    Objective:    Physical Examination Vitals:   04/20/20 0936  BP: 110/78  Pulse: 62  SpO2: 98%   MSK: C-spine normal motion. Neuro: Oriented normal coordination. Psych: Speech thought process and affect.  Describes irritability is main issue.   Assessment and Plan    29 y.o. female with postconcussion syndrome.  Main issues are dizziness, irritability, and difficulty with focus and attention.  At last visit was started on Cymbalta with titration of 40.  When she increased it from 20-40 she developed worsening nausea.  She does find the medicine to be helpful.  We will decrease Cymbalta 30 mg daily and reassess in a month.  Inattention: No history of ADHD.  However at this point she effectively has postconcussion syndrome and ADHD-like symptoms.  Reasonable to consider ADHD type stimulant medication in the future if not improving.  We will consider Adderall type as first-line.  Hopeful that should continue to get improvement with Cymbalta.  Dizziness: Starting vestibular therapy.  Hopefully this will improve.  Recheck in 1 month.  Additionally given her prolonged recovery will get second opinion from neurology as well.      Action/Discussion: Reviewed diagnosis, management options, expected outcomes, and the reasons for scheduled and emergent follow-up. Questions were adequately answered. Patient expressed verbal understanding and agreement with the following plan.     Patient Education:  Reviewed with patient the risks (i.e, a repeat concussion, post-concussion syndrome, second-impact syndrome) of returning to play prior to complete resolution, and thoroughly reviewed the signs and symptoms of concussion.Reviewed need for complete resolution of all symptoms, with rest AND exertion, prior to return to play.  Reviewed red flags for  urgent medical evaluation: worsening symptoms, nausea/vomiting, intractable headache, musculoskeletal changes, focal neurological deficits.  [facility] Concussion Care Plan, which clearly outlines the plans stated above, was given to patient.   In addition to the time spent performing tests, I spent 30 min   Reviewed with patient the risks (i.e, a repeat concussion, post-concussion syndrome,  second-impact syndrome) of returning to play prior to complete resolution, and thoroughly reviewed the signs and symptoms of      concussion. Reviewedf need for complete resolution of all symptoms, with rest AND exertion, prior to return to play.  Reviewed red flags for urgent medical evaluation: worsening symptoms, nausea/vomiting, intractable headache, musculoskeletal changes, focal neurological deficits.  [facility] Concussion Care Plan, which clearly outlines the plans stated above, was given to patient   After Visit Summary printed out and provided to patient as appropriate.  The above documentation has been reviewed and is accurate and complete Clementeen Graham

## 2020-04-20 NOTE — Patient Instructions (Signed)
Thank you for coming in today. Change cymbalta to 30mg  daily.  If concentration and focus is not improving prior to the next visit let me know and I can add Adderall type medicine for ADHD type symptoms.  Irritability is more anxiety which Cymbalta should help with.   I am also referring to neurology for a second opinion. You should hear form them soonish.   Recheck in 1 month.

## 2020-04-26 ENCOUNTER — Ambulatory Visit: Payer: No Typology Code available for payment source | Attending: Family Medicine | Admitting: Physical Therapy

## 2020-04-26 ENCOUNTER — Encounter: Payer: Self-pay | Admitting: Physical Therapy

## 2020-04-26 ENCOUNTER — Other Ambulatory Visit: Payer: Self-pay

## 2020-04-26 DIAGNOSIS — R2689 Other abnormalities of gait and mobility: Secondary | ICD-10-CM | POA: Diagnosis present

## 2020-04-26 DIAGNOSIS — R2681 Unsteadiness on feet: Secondary | ICD-10-CM

## 2020-04-26 DIAGNOSIS — R42 Dizziness and giddiness: Secondary | ICD-10-CM | POA: Diagnosis not present

## 2020-04-26 NOTE — Therapy (Signed)
[facility] [address] [place], [place], [address] Phone: [phone]   Fax:  [phone]  Physical Therapy Treatment  Patient Details  Name: Lindsey Duran MRN: [id number] Date of Birth: [birth year] Referring Provider (PT): [name], MD   Encounter Date: 04/26/2020   PT End of Session - 04/26/20 1420    Visit Number 14    Number of Visits 20    Date for PT Re-Evaluation 05/20/20   extended target date due to delay in initiating treatment due to no appt availability   Authorization Type [employer] [place] [employer]    PT Start Time [phone]    PT Stop Time 0801    PT Time Calculation (min) 44 min    Activity Tolerance Patient tolerated treatment well    Behavior During Therapy [facility] for tasks assessed/performed           Past Medical History:  Diagnosis Date  . Anemia   . Asthma   . Migraines     Past Surgical History:  Procedure Laterality Date  . TONSILECTOMY, ADENOIDECTOMY, BILATERAL MYRINGOTOMY AND TUBES      There were no vitals filed for this visit.   Subjective Assessment - 04/26/20 0721    Subjective Pt states Dr. Georgina Snell made a referral to neurology for her   Lt eye noted to be less open than Rt eye today - informed pt of this observation and she states that her dad had mentioned this to her before (new observation to me (PT)   Pertinent History asthma, h/o migraines (pt denies migraines prior to accident)    Patient Stated Goals "get my right side of my body congruent with my left side and feel confident again"    Currently in Pain? Yes    Pain Score 1     Pain Location Head    Pain Orientation Right    Pain Descriptors / Indicators Headache    Pain Type Chronic pain    Pain Onset More than a month ago    Pain Frequency Intermittent                                  Balance Exercises - 04/26/20 0001      Balance Exercises: Standing   Standing Eyes Opened Narrow base of support  (BOS);Wide (BOA);Head turns;Foam/compliant surface;5 reps    Standing Eyes Closed Narrow base of support (BOS);Wide (BOA);Head turns;Foam/compliant surface;5 reps   on blue mat    Rockerboard Anterior/posterior;EO;EC;10 reps    Turning Right;Left;Other reps (comment)   4 reps - 2 to each side   Other Standing Exercises pt amb. making circles clockwise, counterclockwise, "V" and "+" patterns 30' x 2 reps each direction    Other Standing Exercises Comments marching on mat with head turns - EO - in place and then marched forward and backward 3 reps on the blue mat for compliant surface training           Standing on blue mat - fig. 8's with ball between legs -180 degree turns 4 reps; then 360 degree turns 3 reps       PT Long Term Goals - 04/26/20 1424      PT LONG TERM GOAL #1   Title Pt will demo improved VOR function by testing WNL's for DVA with no c/o dizziness upon completion of test.    Baseline 1 line difference with c/o dizziness; met  02-16-20; met on 04-12-20 with initially a 3 line difference, but pt able to read 2nd line above baseline with continued focus & concentration    Time 4    Period Weeks    Status Achieved      PT LONG TERM GOAL #2   Title Pt will amb. 23' with horizontal head turns with dizziness rating </= 1/10 for incr. ease with environmental scanning.    Baseline 4/10 intensity on 02-16-20; 2/10 intensity rating on 04-13-20    Time 4    Period Weeks    Status On-going      PT LONG TERM GOAL #3   Title Complete SOT and establish LTG as appropriate.                Improve vestibular input to >/= 50/100 for improved balance and mobility.    Baseline met on 04-12-20 for vestibular - visual input is now decreased    Time 6    Period Weeks    Status Achieved      PT LONG TERM GOAL #4   Title Pt will subjectively report at least 75% improvement in dizziness, Rt shoulder pain, and RLE mobility/flexibility.    Baseline 40-50% improvement; Rt shoulder pain still  occurs intermittently - same on 04-12-20 per pt report    Time 4    Period Weeks    Status On-going      PT LONG TERM GOAL #5   Title Independent in HEP for balance, vestibular and aquatic exercises.    Baseline aquatic exercises to be added to HEP - 04-12-20    Time 4    Period Weeks    Status On-going      PT LONG TERM GOAL #6   Title Increase visual input on SOT to WNL's for improved balance and gaze stabilization; increase composite score to >/= 65/100 to demo improved balance.    Baseline visual input score 30/100; composite score 41/100 on 04-12-20    Time 4    Period Weeks    Status New                 Plan - 04/26/20 1421    Clinical Impression Statement Pt reports increased disomfort/"pounding" on Rt side of head with vestibular activities- saying "it's my eyes" during activities during session. No LOB occurred during activities.    Personal Factors and Comorbidities Comorbidity 1;Time since onset of injury/illness/exacerbation;Fitness;Age    Comorbidities asthma, migraines    Examination-Activity Limitations Locomotion Level;Bed Mobility;Squat;Stand;Reach Overhead;Bend;Caring for Others    Examination-Participation Restrictions Cleaning;Meal Prep;Community Activity;Driving;Yard Work;Interpersonal Relationship;Laundry;Other   work   Stability/Clinical Decision Making Stable/Uncomplicated    Rehab Potential Good    PT Frequency 2x / week    PT Duration 4 weeks    PT Treatment/Interventions ADLs/Self Care Home Management;Balance training;Vestibular;Neuromuscular re-education;Patient/family education;Aquatic Therapy;Therapeutic activities;Gait training;Therapeutic exercise    PT Next Visit Plan cont vestibular, gaze exercises    PT Home Exercise Plan x1 viewing; balance on foam exs.    Consulted and Agree with Plan of Care Patient           Patient will benefit from skilled therapeutic intervention in order to improve the following deficits and impairments:   Dizziness, Decreased balance, Decreased activity tolerance, Pain, Decreased strength, Difficulty walking  Visit Diagnosis: Dizziness and giddiness  Unsteadiness on feet     Problem List Patient Active Problem List   Diagnosis Date Noted  . Post concussion syndrome 12/17/2019  . History of asthma  12/17/2019    Alda Lea, PT 04/26/2020, 2:26 PM  [facility] [address] [place], [place], [address] Phone: [phone]   Fax:  [phone]  Name: Lindsey Duran MRN: [id number] Date of Birth: [birth year]

## 2020-04-28 ENCOUNTER — Encounter: Payer: Self-pay | Admitting: Neurology

## 2020-05-02 ENCOUNTER — Ambulatory Visit: Payer: No Typology Code available for payment source | Admitting: Physical Therapy

## 2020-05-02 ENCOUNTER — Other Ambulatory Visit: Payer: Self-pay

## 2020-05-02 DIAGNOSIS — R2681 Unsteadiness on feet: Secondary | ICD-10-CM

## 2020-05-02 DIAGNOSIS — R2689 Other abnormalities of gait and mobility: Secondary | ICD-10-CM

## 2020-05-02 DIAGNOSIS — R42 Dizziness and giddiness: Secondary | ICD-10-CM

## 2020-05-03 ENCOUNTER — Encounter: Payer: Self-pay | Admitting: Physical Therapy

## 2020-05-03 NOTE — Therapy (Signed)
[facility] [address] [place], [place], [address] Phone: [phone]   Fax:  [phone]  Physical Therapy Treatment  Patient Details  Name: Lindsey Duran MRN: [id number] Date of Birth: 1991/01/20 Referring Provider (PT): [name], MD   Encounter Date: 05/02/2020   PT End of Session - 05/03/20 0850    Visit Number 15    Number of Visits 20    Date for PT Re-Evaluation 05/20/20   extended target date due to delay in initiating treatment due to no appt availability   Authorization Type [employer]    PT Start Time [phone]    PT Stop Time 1630    PT Time Calculation (min) 45 min    Activity Tolerance Patient tolerated treatment well    Behavior During Therapy [facility] for tasks assessed/performed           Past Medical History:  Diagnosis Date  . Anemia   . Asthma   . Migraines     Past Surgical History:  Procedure Laterality Date  . TONSILECTOMY, ADENOIDECTOMY, BILATERAL MYRINGOTOMY AND TUBES      There were no vitals filed for this visit.   Subjective Assessment - 05/03/20 0848    Subjective First neurology appt she could get is in October but is on the wait list.  Feels like cymbalta is helping with irritation but still present along with headache and nausea at times.   Lt eye noted to be less open than Rt eye today - informed pt of this observation and she states that her dad had mentioned this to her before (new observation to me (PT)   Pertinent History asthma, h/o migraines (pt denies migraines prior to accident)    Patient Stated Goals "get my right side of my body congruent with my left side and feel confident again"    Currently in Pain? Yes    Pain Location Head    Pain Descriptors / Indicators Squeezing    Pain Type Chronic pain    Pain Onset More than a month ago    Pain Frequency Constant    Aggravating Factors  jumping, concentration, balance activities           Aquatic therapy  at [facility] - pool temp. 87.4 degrees  Patient seen for aquatic therapy today. Treatment took place in water 4 feet deep depending upon activity. Pt entered/exited the pool via ladder at pool edge with supervision.  Pt performed bil. Hamstring and heel cord stretch - runner's stretch 30 sec hold x 1 rep; heel cord stretch with toes/forefoot on pool wall 30 sec hold x 1 rep  Pt performed water walking forwards, backwards and sideways with squat x44mx 2 reps of each; Had pt place UE's under water for increased UE resistance for strengthening.  Sideways stepping with squats242m2 repswith UE bar bells for resistance.  Forward marching with opposite arm swing with UE bar bells x 2565mrward then 36m23mkward.  Stopping at times to maintain proper sequence.  Pt performed jogging36mx2ms across pool with cues for reciprocal arm movement. Brief rest breakbetween each rep.  Later in session performed jogging x 33m x16mep.  Jumping jacks x 10 reps x 2 sets with increased c/o HA/dizziness.  Jumping lunge in place x 10 reps x 2 sets.  Squats x 10 reps with intermittent UE support x 2 sets.  At pool edge for bil LE marching, hip extension, hip abd, hip add and march  into hip extension x 15 reps alternating sides.  Pt did require 1 UE support during most exercises for balance and difficulty coordinating at times.  Ai Chi posture of soothing,enclosingand gatheringx 15 reps each.  Difficulty coordinating movements and needs verbal and visual cues for technique and to maintain sequence.  Standing eyes open feet apart for head turns then head nods x 10 reps each x 2 sets then with feet together with same.  Standing eyes closed feet apart then feet together x 30 sec x 2 reps.  Pt reports increased relaxation and decreased HA with Ai Chi postures.Pt reporting increased "irritation" with any activities that require increased concentration or coordination along with any activities that  evoke dizziness/HA.      PT Long Term Goals - 04/26/20 1424      PT LONG TERM GOAL #1   Title Pt will demo improved VOR function by testing WNL's for DVA with no c/o dizziness upon completion of test.    Baseline 1 line difference with c/o dizziness; met 02-16-20; met on 04-12-20 with initially a 3 line difference, but pt able to read 2nd line above baseline with continued focus & concentration    Time 4    Period Weeks    Status Achieved      PT LONG TERM GOAL #2   Title Pt will amb. 69' with horizontal head turns with dizziness rating </= 1/10 for incr. ease with environmental scanning.    Baseline 4/10 intensity on 02-16-20; 2/10 intensity rating on 04-13-20    Time 4    Period Weeks    Status On-going      PT LONG TERM GOAL #3   Title Complete SOT and establish LTG as appropriate.                Improve vestibular input to >/= 50/100 for improved balance and mobility.    Baseline met on 04-12-20 for vestibular - visual input is now decreased    Time 6    Period Weeks    Status Achieved      PT LONG TERM GOAL #4   Title Pt will subjectively report at least 75% improvement in dizziness, Rt shoulder pain, and RLE mobility/flexibility.    Baseline 40-50% improvement; Rt shoulder pain still occurs intermittently - same on 04-12-20 per pt report    Time 4    Period Weeks    Status On-going      PT LONG TERM GOAL #5   Title Independent in HEP for balance, vestibular and aquatic exercises.    Baseline aquatic exercises to be added to HEP - 04-12-20    Time 4    Period Weeks    Status On-going      PT LONG TERM GOAL #6   Title Increase visual input on SOT to WNL's for improved balance and gaze stabilization; increase composite score to >/= 65/100 to demo improved balance.    Baseline visual input score 30/100; composite score 41/100 on 04-12-20    Time 4    Period Weeks    Status New                 Plan - 05/03/20 0851    Clinical Impression Statement Pt continues to  report "squeezing and tightness" in head and eye with certain activities but works through pain during session.  Did have increased dizziness when getting out of pool today and reports it felt similar to when she gets out of the  car sometimes.  Cont per POC.    Personal Factors and Comorbidities Comorbidity 1;Time since onset of injury/illness/exacerbation;Fitness;Age    Comorbidities asthma, migraines    Examination-Activity Limitations Locomotion Level;Bed Mobility;Squat;Stand;Reach Overhead;Bend;Caring for Others    Examination-Participation Restrictions Cleaning;Meal Prep;Community Activity;Driving;Yard Work;Interpersonal Relationship;Laundry;Other   work   Stability/Clinical Decision Making Stable/Uncomplicated    Rehab Potential Good    PT Frequency 2x / week    PT Duration 4 weeks    PT Treatment/Interventions ADLs/Self Care Home Management;Balance training;Vestibular;Neuromuscular re-education;Patient/family education;Aquatic Therapy;Therapeutic activities;Gait training;Therapeutic exercise    PT Next Visit Plan cont vestibular, gaze exercises    PT Home Exercise Plan x1 viewing; balance on foam exs.    Consulted and Agree with Plan of Care Patient           Patient will benefit from skilled therapeutic intervention in order to improve the following deficits and impairments:  Dizziness, Decreased balance, Decreased activity tolerance, Pain, Decreased strength, Difficulty walking  Visit Diagnosis: Dizziness and giddiness  Unsteadiness on feet  Other abnormalities of gait and mobility     Problem List Patient Active Problem List   Diagnosis Date Noted  . Post concussion syndrome 12/17/2019  . History of asthma 12/17/2019    Narda Bonds, PTA [facility] 05/03/20 8:57 AM Phone: [phone] Fax: [facility] [address] [place], [place],  [address] Phone: [phone]   Fax:  [phone]  Name: Lindsey Duran MRN: [id number] Date of Birth: 27-Sep-1991

## 2020-05-09 ENCOUNTER — Encounter: Payer: Self-pay | Admitting: Physical Therapy

## 2020-05-09 ENCOUNTER — Ambulatory Visit: Payer: No Typology Code available for payment source | Admitting: Physical Therapy

## 2020-05-09 ENCOUNTER — Other Ambulatory Visit: Payer: Self-pay

## 2020-05-09 DIAGNOSIS — R2681 Unsteadiness on feet: Secondary | ICD-10-CM

## 2020-05-09 DIAGNOSIS — R2689 Other abnormalities of gait and mobility: Secondary | ICD-10-CM

## 2020-05-09 DIAGNOSIS — R42 Dizziness and giddiness: Secondary | ICD-10-CM | POA: Diagnosis not present

## 2020-05-10 NOTE — Therapy (Signed)
[facility] [address] [place], [place], [address] Phone: [phone]   Fax:  [phone]  Physical Therapy Treatment  Patient Details  Name: Lindsey Duran MRN: [id number] Date of Birth: 1991/04/12 Referring Provider (PT): [name], MD   Encounter Date: 05/09/2020   PT End of Session - 05/09/20 0812    Visit Number 16    Number of Visits 20    Date for PT Re-Evaluation 05/20/20   extended target date due to delay in initiating treatment due to no appt availability   Authorization Type [employer]    PT Start Time [phone]   pt running late due to rain/weather   PT Stop Time 0842    PT Time Calculation (min) 34 min    Equipment Utilized During Treatment Gait belt    Activity Tolerance Patient tolerated treatment well    Behavior During Therapy [facility] for tasks assessed/performed           Past Medical History:  Diagnosis Date  . Anemia   . Asthma   . Migraines     Past Surgical History:  Procedure Laterality Date  . TONSILECTOMY, ADENOIDECTOMY, BILATERAL MYRINGOTOMY AND TUBES      There were no vitals filed for this visit.   Subjective Assessment - 05/09/20 0809    Subjective No new complaints. Reports no changes in dizziness and headaches. Nausea comes and goes still, no change with as well. Eyes noted to be equal in size and reaction today on arrival to session.    Pertinent History asthma, h/o migraines (pt denies migraines prior to accident)    Patient Stated Goals "get my right side of my body congruent with my left side and feel confident again"    Currently in Pain? Yes    Pain Score 2     Pain Location Head    Pain Orientation Right    Pain Descriptors / Indicators Headache    Pain Type Chronic pain    Pain Onset More than a month ago    Pain Frequency Constant    Aggravating Factors  jumping, concentration, balance activities    Pain Relieving Factors tylenol, rest                  [facility] Adult PT Treatment/Exercise - 05/09/20 0813      Ambulation/Gait   Ambulation/Gait Yes    Ambulation/Gait Assistance 5: Supervision    Assistive device None    Gait Pattern Within Functional Limits    Ambulation Surface Level;Indoor    Gait Comments gait around track for 115 feet each- moving the ball up/down, side to side, circle CW, circle CCW, "T", "V". close supervision for safety. minor increase in symptomes with last 3 ball movements.       High Level Balance   High Level Balance Comments on blue mat over floor- figure 8 around legs, coming up to turn 90*, then performing a figure 8 around LE's again. performed for 5 turns each way with a pause to allow dizziness to resolved after 90* turn each time. min guard assist for safety      Neuro Re-ed    Neuro Re-ed Details  for balance/muscle re-ed: blue mat over ramp- facing up ramp with feet together for EC no head movements 30 sec's x3 reps, then feet hip width apart for EC head movements left<>right, up<>down for 10 reps each, min guard assist.  mild increase in symptoms that resolved with rest break.  then on mat with feet hip width apart with ball- pt moved ball in "T", then "V" while tracking the ball, 10 reps each with min guard assist for balance. mild dizziness with "V", no symptoms with "T".                  PT Long Term Goals - 04/26/20 1424      PT LONG TERM GOAL #1   Title Pt will demo improved VOR function by testing WNL's for DVA with no c/o dizziness upon completion of test.    Baseline 1 line difference with c/o dizziness; met 02-16-20; met on 04-12-20 with initially a 3 line difference, but pt able to read 2nd line above baseline with continued focus & concentration    Time 4    Period Weeks    Status Achieved      PT LONG TERM GOAL #2   Title Pt will amb. 31' with horizontal head turns with dizziness rating </= 1/10 for incr. ease with environmental scanning.    Baseline 4/10 intensity on  02-16-20; 2/10 intensity rating on 04-13-20    Time 4    Period Weeks    Status On-going      PT LONG TERM GOAL #3   Title Complete SOT and establish LTG as appropriate.                Improve vestibular input to >/= 50/100 for improved balance and mobility.    Baseline met on 04-12-20 for vestibular - visual input is now decreased    Time 6    Period Weeks    Status Achieved      PT LONG TERM GOAL #4   Title Pt will subjectively report at least 75% improvement in dizziness, Rt shoulder pain, and RLE mobility/flexibility.    Baseline 40-50% improvement; Rt shoulder pain still occurs intermittently - same on 04-12-20 per pt report    Time 4    Period Weeks    Status On-going      PT LONG TERM GOAL #5   Title Independent in HEP for balance, vestibular and aquatic exercises.    Baseline aquatic exercises to be added to HEP - 04-12-20    Time 4    Period Weeks    Status On-going      PT LONG TERM GOAL #6   Title Increase visual input on SOT to WNL's for improved balance and gaze stabilization; increase composite score to >/= 65/100 to demo improved balance.    Baseline visual input score 30/100; composite score 41/100 on 04-12-20    Time 4    Period Weeks    Status New                 Plan - 05/09/20 0813    Clinical Impression Statement Today's skilled session continued to work on balance training with gaze and vestibular ex's with mild symptoms reported at times that resolved quickly. The pt appears to be progressing toward goals and should benefit from continued PT to progress toward unmet goals.    Personal Factors and Comorbidities Comorbidity 1;Time since onset of injury/illness/exacerbation;Fitness;Age    Comorbidities asthma, migraines    Examination-Activity Limitations Locomotion Level;Bed Mobility;Squat;Stand;Reach Overhead;Bend;Caring for Others    Examination-Participation Restrictions Cleaning;Meal Prep;Community Activity;Driving;Yard Work;Interpersonal  Relationship;Laundry;Other   work   Stability/Clinical Decision Making Stable/Uncomplicated    Rehab Potential Good    PT Frequency 2x / week    PT Duration 4 weeks  PT Treatment/Interventions ADLs/Self Care Home Management;Balance training;Vestibular;Neuromuscular re-education;Patient/family education;Aquatic Therapy;Therapeutic activities;Gait training;Therapeutic exercise    PT Next Visit Plan cont vestibular, gaze exercises    PT Home Exercise Plan x1 viewing; balance on foam exs.    Consulted and Agree with Plan of Care Patient           Patient will benefit from skilled therapeutic intervention in order to improve the following deficits and impairments:  Dizziness, Decreased balance, Decreased activity tolerance, Pain, Decreased strength, Difficulty walking  Visit Diagnosis: Dizziness and giddiness  Unsteadiness on feet  Other abnormalities of gait and mobility     Problem List Patient Active Problem List   Diagnosis Date Noted  . Post concussion syndrome 12/17/2019  . History of asthma 12/17/2019   Willow Ora, PTA, [facility] [address] [place], [place] [address] [phone] 05/10/20, 12:53 PM   Name: Lindsey Duran MRN: [id number] Date of Birth: [birth year]

## 2020-05-11 ENCOUNTER — Other Ambulatory Visit: Payer: Self-pay

## 2020-05-11 ENCOUNTER — Encounter: Payer: Self-pay | Admitting: Physical Therapy

## 2020-05-11 ENCOUNTER — Ambulatory Visit: Payer: No Typology Code available for payment source | Admitting: Physical Therapy

## 2020-05-11 DIAGNOSIS — R42 Dizziness and giddiness: Secondary | ICD-10-CM | POA: Diagnosis not present

## 2020-05-11 DIAGNOSIS — R2689 Other abnormalities of gait and mobility: Secondary | ICD-10-CM

## 2020-05-11 DIAGNOSIS — R2681 Unsteadiness on feet: Secondary | ICD-10-CM

## 2020-05-11 NOTE — Therapy (Signed)
Lindsey Duran [address] [place], [place], [address] Phone: [phone]   Fax:  [phone]  Physical Therapy Treatment  Patient Details  Name: Lindsey Duran MRN: [id number] Date of Birth: January 09, 1991 Referring Provider (PT): [name], MD   Encounter Date: 05/11/2020   PT End of Session - 05/11/20 1831    Visit Number 17    Number of Visits 20    Date for PT Re-Evaluation 05/20/20   extended target date due to delay in initiating treatment due to no appt availability   Authorization Type [employer]    PT Start Time 1330    PT Stop Time 1415    PT Time Calculation (min) 45 min    Equipment Utilized During Treatment --   arm buoyancy bar bells   Activity Tolerance Patient tolerated treatment well    Behavior During Therapy [facility] for tasks assessed/performed           Past Medical History:  Diagnosis Date  . Anemia   . Asthma   . Migraines     Past Surgical History:  Procedure Laterality Date  . TONSILECTOMY, ADENOIDECTOMY, BILATERAL MYRINGOTOMY AND TUBES      There were no vitals filed for this visit.   Subjective Assessment - 05/11/20 1832    Subjective Pt reports when she has the pressure in her head that closing her right eye seems to help it equalize.    Pertinent History asthma, h/o migraines (pt denies migraines prior to accident)    Patient Stated Goals "get my right side of my body congruent with my left side and feel confident again"    Currently in Pain? Yes    Pain Score --   varies through out treatment with activities from 0-5   Pain Location Head    Pain Orientation Right;Lateral;Anterior    Pain Descriptors / Indicators Headache;Pressure    Pain Type Chronic pain    Pain Onset More than a month ago    Pain Frequency Intermittent    Aggravating Factors  pylometric activities           Aquatic therapy at [facility] - pool temp. 87.4 degrees  Patient seen for aquatic therapy  today. Treatment took place in water 4 feet deep depending upon activity. Pt entered/exitedthe pool via ladder at pool edge withsupervision.  Pt performed bil. Hamstring and heel cord stretch - runner's stretch 30 sec hold x 1 rep; heel cord stretch with toes/forefoot on pool wall 30 sec hold x 1 rep  Pt performed water walking forwards, backwards and sideways with squatx73mx 2 reps of each; Had pt place UE's under water for increased UE resistance for strengthening.  Sideways stepping with squats274m2 repswith UE bar bells for resistance.  Forward marching with opposite arm swing with UE bar bells x 2516mrward x 2 then 30m17m backward.  Stopping at times to maintain proper sequence.  Pt performed jogging30mx72ms across pool with cues for reciprocal arm movement. Brief rest breakbetween each rep.    Jumping jacks x 10 reps x 2 sets with increased c/o HA/dizziness.  Jumping lunge in place x 10 reps x 2 sets.  Squats x 10 reps with intermittent UE support x 2 sets.  Ai Chi posture of soothing,enclosingand gatheringx 15 reps each.Difficulty coordinating movements and needs verbal and visual cues for technique and to maintain sequence.  Standing eyes open feet together for head turns then head nods x 10 reps each  x 2 sets.  Standing eyes closed feet apart then feet together x 30 sec x 2 reps.  Pt reports increased relaxation and decreased HA with Ai Chi postures.Pt reporting increased "irritation" with any activities that require increased concentration or coordination along with any activities that evoke dizziness/HA.       PT Long Term Goals - 05/10/20 1252      PT LONG TERM GOAL #1   Title Pt will demo improved VOR function by testing WNL's for DVA with no c/o dizziness upon completion of test.    Baseline 1 line difference with c/o dizziness; met 02-16-20; met on 04-12-20 with initially a 3 line difference, but pt able to read 2nd line above baseline  with continued focus & concentration    Status Achieved      PT LONG TERM GOAL #2   Title Pt will amb. 27' with horizontal head turns with dizziness rating </= 1/10 for incr. ease with environmental scanning. (all LTGs due 05/20/20)    Baseline 4/10 intensity on 02-16-20; 2/10 intensity rating on 04-13-20    Time 4    Period Weeks    Status On-going      PT LONG TERM GOAL #3   Title Complete SOT and establish LTG as appropriate.                Improve vestibular input to >/= 50/100 for improved balance and mobility.    Baseline met on 04-12-20 for vestibular - visual input is now decreased    Status Achieved      PT LONG TERM GOAL #4   Title Pt will subjectively report at least 75% improvement in dizziness, Rt shoulder pain, and RLE mobility/flexibility.    Baseline 40-50% improvement; Rt shoulder pain still occurs intermittently - same on 04-12-20 per pt report    Time 4    Period Weeks    Status On-going      PT LONG TERM GOAL #5   Title Independent in HEP for balance, vestibular and aquatic exercises.    Baseline aquatic exercises to be added to HEP - 04-12-20    Time 4    Period Weeks    Status On-going      PT LONG TERM GOAL #6   Title Increase visual input on SOT to WNL's for improved balance and gaze stabilization; increase composite score to >/= 65/100 to demo improved balance.    Baseline visual input score 30/100; composite score 41/100 on 04-12-20    Time 4    Period Weeks    Status On-going                 Plan - 05/11/20 1853    Clinical Impression Statement Pt continues to progress toward goals with less "pressure" reported overall but continues to have increased pressure with pylometric activities.  Cont per poc.    Personal Factors and Comorbidities Comorbidity 1;Time since onset of injury/illness/exacerbation;Fitness;Age    Comorbidities asthma, migraines    Examination-Activity Limitations Locomotion Level;Bed Mobility;Squat;Stand;Reach Overhead;Bend;Caring  for Others    Examination-Participation Restrictions Cleaning;Meal Prep;Community Activity;Driving;Yard Work;Interpersonal Relationship;Laundry;Other   work   Stability/Clinical Decision Making Stable/Uncomplicated    Rehab Potential Good    PT Frequency 2x / week    PT Duration 4 weeks    PT Treatment/Interventions ADLs/Self Care Home Management;Balance training;Vestibular;Neuromuscular re-education;Patient/family education;Aquatic Therapy;Therapeutic activities;Gait training;Therapeutic exercise    PT Next Visit Plan Check LTG's.    PT Home Exercise Plan x1 viewing; balance on foam  exs.    Consulted and Agree with Plan of Care Patient           Patient will benefit from skilled therapeutic intervention in order to improve the following deficits and impairments:  Dizziness, Decreased balance, Decreased activity tolerance, Pain, Decreased strength, Difficulty walking  Visit Diagnosis: Dizziness and giddiness  Unsteadiness on feet  Other abnormalities of gait and mobility     Problem List Patient Active Problem List   Diagnosis Date Noted  . Post concussion syndrome 12/17/2019  . History of asthma 12/17/2019    Narda Bonds, PTA [place] 05/11/20 6:58 PM Phone: [phone] Fax: [place] [address] [place], [place], [address] Phone: [phone]   Fax:  [phone]  Name: Reene Harlacher MRN: [id number] Date of Birth: 09-25-1991

## 2020-05-24 ENCOUNTER — Ambulatory Visit: Payer: No Typology Code available for payment source | Admitting: Physical Therapy

## 2020-05-25 ENCOUNTER — Encounter: Payer: Self-pay | Admitting: Family Medicine

## 2020-05-25 ENCOUNTER — Ambulatory Visit (INDEPENDENT_AMBULATORY_CARE_PROVIDER_SITE_OTHER): Payer: No Typology Code available for payment source | Admitting: Family Medicine

## 2020-05-25 ENCOUNTER — Other Ambulatory Visit: Payer: Self-pay

## 2020-05-25 VITALS — BP 100/68 | HR 84 | Ht 68.0 in | Wt 153.6 lb

## 2020-05-25 DIAGNOSIS — F0781 Postconcussional syndrome: Secondary | ICD-10-CM | POA: Diagnosis not present

## 2020-05-25 MED ORDER — DULOXETINE HCL 30 MG PO CPEP: 30.0000 mg | ORAL_CAPSULE | Freq: Every day | ORAL | 3 refills | Status: DC

## 2020-05-25 NOTE — Patient Instructions (Addendum)
Thank you for coming in today. Plan to return as needed.  Continue home exercise and advance as tolerated.

## 2020-05-25 NOTE — Progress Notes (Signed)
Subjective:    Chief Complaint: Lindsey Duran, LAT, ATC, am serving as scribe for Dr. Clementeen Graham.  Lindsey Duran,  is a 29 y.o. female who presents for f/u of concussion that she sustained on 12/04/19 when she was hit on top of the head by some snow/ice that fell off the roof.  She was last seen by Dr. Denyse Amass on 04/20/20 w/ con't c/o mood swings, irritability, difficulty concentrating/focusing, dizziness and nausea.  She has completed 17 PT sessions.  Since her last visit, pt reports that she has been feeling better overall.  Her biggest issue is w/ habituation to new stimuli.  Injury date : 12/04/19 Visit #: 5   History of Present Illness:    Concussion Self-Reported Symptom Score Symptoms rated on a scale 1-6, in last 24 hours   Headache: 0    Nausea: 2  Dizziness: 2  Vomiting: 0  Balance Difficulty: 0   Trouble Falling Asleep: 0   Fatigue: 0  Sleep Less Than Usual: 0  Daytime Drowsiness: 0  Sleep More Than Usual: 0  Photophobia: 2  Phonophobia: 0  Irritability: 1  Sadness: 0  Numbness or Tingling: 0  Nervousness: 0  Feeling More Emotional: 0  Feeling Mentally Foggy: 1  Feeling Slowed Down: 0  Memory Problems: 0  Difficulty Concentrating: 0  Visual Problems: 1   Total # of Symptoms: 6/22 Total Symptom Score:  Previous Total # of Symptoms: 15/22 Previous Symptom Score: 51/132   Neck Pain: No  Tinnitus: No  Review of Systems: No fevers or chills   Review of History: Migraine history  Objective:    Physical Examination Vitals:   05/25/20 0942  BP: 100/68  Pulse: 84  SpO2: 97%   MSK: Normal cervical motion Neuro: Alert and oriented normal coordination balance and gait. Psych: Normal speech thought process and affect.     Assessment and Plan   29 y.o. female with postconcussion syndrome.  Significant improvement with Cymbalta and balance PT and time.  Doing well enough now that we can continue as needed follow-up appointments.  Recheck back  with me as needed continue current regiment.  Cymbalta refilled.      Action/Discussion: Reviewed diagnosis, management options, expected outcomes, and the reasons for scheduled and emergent follow-up. Questions were adequately answered. Patient expressed verbal understanding and agreement with the following plan.     Patient Education:  Reviewed with patient the risks (i.e, a repeat concussion, post-concussion syndrome, second-impact syndrome) of returning to play prior to complete resolution, and thoroughly reviewed the signs and symptoms of concussion.Reviewed need for complete resolution of all symptoms, with rest AND exertion, prior to return to play.  Reviewed red flags for urgent medical evaluation: worsening symptoms, nausea/vomiting, intractable headache, musculoskeletal changes, focal neurological deficits.  [facility] Concussion Care Plan, which clearly outlines the plans stated above, was given to patient.   In addition to the time spent performing tests, I spent 20 min   Reviewed with patient the risks (i.e, a repeat concussion, post-concussion syndrome, second-impact syndrome) of returning to play prior to complete resolution, and thoroughly reviewed the signs and symptoms of      concussion. Reviewedf need for complete resolution of all symptoms, with rest AND exertion, prior to return to play.  Reviewed red flags for urgent medical evaluation: worsening symptoms, nausea/vomiting, intractable headache, musculoskeletal changes, focal neurological deficits.  [facility] Concussion Care Plan, which clearly outlines the plans stated above, was given to patient  After Visit Summary printed out and provided to patient as appropriate.  The above documentation has been reviewed and is accurate and complete [name]

## 2020-05-30 ENCOUNTER — Ambulatory Visit: Payer: No Typology Code available for payment source | Attending: Family Medicine | Admitting: Physical Therapy

## 2020-05-30 ENCOUNTER — Other Ambulatory Visit: Payer: Self-pay

## 2020-05-30 ENCOUNTER — Encounter: Payer: Self-pay | Admitting: Physical Therapy

## 2020-05-30 DIAGNOSIS — R42 Dizziness and giddiness: Secondary | ICD-10-CM | POA: Insufficient documentation

## 2020-05-30 DIAGNOSIS — R2681 Unsteadiness on feet: Secondary | ICD-10-CM | POA: Diagnosis present

## 2020-05-30 DIAGNOSIS — R2689 Other abnormalities of gait and mobility: Secondary | ICD-10-CM | POA: Diagnosis present

## 2020-05-30 NOTE — Therapy (Addendum)
[facility] [address] [place], [place], [address] Phone: [phone]   Fax:  [phone]  Physical Therapy Treatment & Discharge Summary  Patient Details  Name: Lindsey Duran MRN: [id number] Date of Birth: 1991-03-27 Referring Provider (PT): [name], MD   Encounter Date: 05/30/2020   PT End of Session - 05/30/20 0935    Visit Number 18    Number of Visits 20    Date for PT Re-Evaluation 05/20/20   extended target date due to delay in initiating treatment due to no appt availability   Authorization Type [employer] [place] [employer]    PT Start Time [phone]    PT Stop Time 1005   discharge visit, not all time was needed   PT Time Calculation (min) 32 min    Equipment Utilized During Treatment --    Activity Tolerance Patient tolerated treatment well    Behavior During Therapy [facility] for tasks assessed/performed           Past Medical History:  Diagnosis Date  . Anemia   . Asthma   . Migraines     Past Surgical History:  Procedure Laterality Date  . TONSILECTOMY, ADENOIDECTOMY, BILATERAL MYRINGOTOMY AND TUBES      There were no vitals filed for this visit.   Subjective Assessment - 05/30/20 0935    Subjective No new complaints. No falls. Has not been consistent with the HEP, stating today "I'm going to commit more since we are done here".    Pertinent History asthma, h/o migraines (pt denies migraines prior to accident)    Patient Stated Goals "get my right side of my body congruent with my left side and feel confident again"    Currently in Pain? No/denies    Pain Score 0-No pain                 [facility] Adult PT Treatment/Exercise - 05/30/20 1000      Ambulation/Gait   Ambulation/Gait Yes    Ambulation/Gait Assistance 5: Supervision    Ambulation Distance (Feet) 115 Feet    Assistive device None    Gait Pattern Within Functional Limits    Ambulation Surface Level;Indoor    Gait Comments gait with  head movements left, right, up and down randomly with no symptoms or increase in dizziness reported.       Self-Care   Self-Care Other Self-Care Comments    Other Self-Care Comments  reviewed pt's HEP for land. Reports still getting symptoms with x1 viewing in standing. pt is to continue with those. Also doing corner balance on pillows with EC and EC head movements. Pt states she will be more consistent with these since PT is wrapping up. Pt reported no questions on what she has learned to do in the pool as well; Pt reporting feeling an 80-85% improvement in overall symptoms and function.            Neuro re-ed: sensory organization test performed with following results: Conditions: 1: above, 2 below 2: all 3 below 3: below, 2 above 4: below, 2 above 5: all 3 above 6: all 3 above Composite score: 72 Sensory Analysis Som: above Vis: above Vest: above Pref: above Strategy analysis: WNL      COG alignment: Centered anterior preference              PT Long Term Goals - 05/30/20 0936      PT LONG TERM GOAL #1   Title Pt will demo  improved VOR function by testing WNL's for DVA with no c/o dizziness upon completion of test.    Baseline 1 line difference with c/o dizziness; met 02-16-20; met on 04-12-20 with initially a 3 line difference, but pt able to read 2nd line above baseline with continued focus & concentration    Status Achieved      PT LONG TERM GOAL #2   Title Pt will amb. 76' with horizontal head turns with dizziness rating </= 1/10 for incr. ease with environmental scanning. (all LTGs due 05/20/20)    Baseline 05/30/20: no increase in symptoms/dizziness reported in session today (1/10 at start and end of gait)    Time --    Period --    Status Achieved      PT LONG TERM GOAL #3   Title Complete SOT and establish LTG as appropriate.                Improve vestibular input to >/= 50/100 for improved balance and mobility.    Baseline met on 04-12-20 for vestibular - visual  input is now decreased    Status Achieved      PT LONG TERM GOAL #4   Title Pt will subjectively report at least 75% improvement in dizziness, Rt shoulder pain, and RLE mobility/flexibility.    Baseline 05/30/20: reports 80-85% improvement since starting therapy    Status Achieved      PT LONG TERM GOAL #5   Title Independent in HEP for balance, vestibular and aquatic exercises.    Baseline 05/30/20: no questions on land or aquatic ex's, inconsistent with performance per pt report    Status Achieved      PT LONG TERM GOAL #6   Title Increase visual input on SOT to WNL's for improved balance and gaze stabilization; increase composite score to >/= 65/100 to demo improved balance.    Baseline 05/30/20: visual imput now above norm and composite score of 72 scored today.    Time --    Period --    Status Achieved                 Plan - 05/30/20 0936    Clinical Impression Statement Today's skilled session focused on progress toward remaining LTGs with all goals met. Pt in agreement with discharge today per PT plan of care.    Personal Factors and Comorbidities Comorbidity 1;Time since onset of injury/illness/exacerbation;Fitness;Age    Comorbidities asthma, migraines    Examination-Activity Limitations Locomotion Level;Bed Mobility;Squat;Stand;Reach Overhead;Bend;Caring for Others    Examination-Participation Restrictions Cleaning;Meal Prep;Community Activity;Driving;Yard Work;Interpersonal Relationship;Laundry;Other   work   Stability/Clinical Decision Making Stable/Uncomplicated    Rehab Potential Good    PT Frequency 2x / week    PT Duration 4 weeks    PT Treatment/Interventions ADLs/Self Care Home Management;Balance training;Vestibular;Neuromuscular re-education;Patient/family education;Aquatic Therapy;Therapeutic activities;Gait training;Therapeutic exercise    PT Next Visit Plan discharge today    PT Home Exercise Plan x1 viewing; balance on foam exs.    Consulted and Agree with  Plan of Care Patient           Patient will benefit from skilled therapeutic intervention in order to improve the following deficits and impairments:  Dizziness, Decreased balance, Decreased activity tolerance, Pain, Decreased strength, Difficulty walking  Visit Diagnosis: Dizziness and giddiness  Unsteadiness on feet  Other abnormalities of gait and mobility     Problem List Patient Active Problem List   Diagnosis Date Noted  . Post concussion syndrome 12/17/2019  .  History of asthma 12/17/2019    Willow Ora, PTA, [place] [address] [place], [place] [address] [phone] 05/30/20, 12:06 PM   Name: Lindsey Duran MRN: [id number] Date of Birth: 07/21/1991  PHYSICAL THERAPY DISCHARGE SUMMARY  Visits from Start of Care: 18  Current functional level related to goals / functional outcomes: See above for progress towards goals - all LTG's met Vestibular input in maintaining balance is WNL's per SOT  Remaining deficits: Pt's gait and balance are WNL's; pt presents with no physical deficits at this time, however, she continues to report cognitive deficits with difficulty concentrating, increased stress and anxiety which then results in headaches.  Pt also reports photosensitivity.   Education / Equipment: Pt has been instructed in HEP for balance and vestibular exercises. Plan: Patient agrees to discharge.  Patient goals were met. Patient is being discharged due to meeting the stated rehab goals.  ?????     Guido Sander, [place], [place] [address] [phone] 11/14/2020, 8:15 AM

## 2020-06-13 ENCOUNTER — Ambulatory Visit (INDEPENDENT_AMBULATORY_CARE_PROVIDER_SITE_OTHER): Payer: Self-pay | Admitting: Neurology

## 2020-06-13 ENCOUNTER — Other Ambulatory Visit: Payer: Self-pay

## 2020-06-13 ENCOUNTER — Encounter: Payer: Self-pay | Admitting: Neurology

## 2020-06-13 VITALS — BP 111/76 | HR 81 | Ht 68.0 in | Wt 154.0 lb

## 2020-06-13 DIAGNOSIS — F0781 Postconcussional syndrome: Secondary | ICD-10-CM

## 2020-06-13 NOTE — Patient Instructions (Signed)
Continue duloxetine 30mg  daily.  In 3 weeks, give me an update and we can increase dose if needed.    To help reduce HEADACHES: Coenzyme Q10 160mg  ONCE DAILY Riboflavin/Vitamin B2 400mg  ONCE DAILY Magnesium oxide 400mg  ONCE - TWICE DAILY May stop after headaches are resolved.                                                                                               To help with INSOMNIA: Melatonin 3-5mg  AT BEDTIME Tart cherry extract, any dose at night    Other medicines to help decrease inflammation Alpha Lipoic Acid 100mg  TWICE DAILY Turmeric 500mg  twice daily Iron 65mg  elemental daily Vitamin D 4000 IU daily for 2 weeks then 2000 IU daily thereafter.  Follow up in 4 months.

## 2020-06-13 NOTE — Progress Notes (Signed)
NEUROLOGY CONSULTATION NOTE  Lindsey Duran MRN: [id number] DOB: 05/11/1991  Referring provider: Clementeen Graham, MD Primary care provider: Abbe Amsterdam, MD  Reason for consult:  Postconcussion syndrome  HISTORY OF PRESENT ILLNESS: Lindsey Duran is a 29 year old female who presents for postconcussion syndrome.  History supplemented by ED and referring provider's notes.  On 12/04/2019, she sustained a concussion in [place] when a piece of ice fell off a roof and hit her on the head.  She did not lose consciousness but developed headache, dizziness and nausea.  She was evaluated at a local ED where she had a CT of the head that revealed no acute abnormalities.  She continued to have headache, nausea, dizziness, difficulty with concentration and irritability, so she went to the [facility] ED for further evaluation.  Exam revealed no concerning findings.  She followed up at the concussion clinic where she underwent physical therapy.  She was started on Cymbalta.    Overall, she has improved but still has sensitivity to light.  If she feels excited or overwhelmed, she may get a headache.  Now it is a mild right frontal pulsation that is now infrequent.  It's no longer painful but it is uncomfortable.  She gets some dizziness, described as a lightheadedness, with change in position or getting up.  She still feels irritated but mood swings is much improved.  Sleep improved but has trazodone as needed.  Unfortunately, it causes nightmares.  Nausea improved overall.  Sometimes still has ringing in her right ear.    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  She has a limited schedule, seeing about 3 patients a day.  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    Current NSAIDs:  none Current analgesics:  Tylenol Current anti-emetic:  Zofran ODT 4mg  Current sleep medication:  Trazodone 50mg  at bedtime PRN Current antidepressant:  Cymbalta 30mg  daily, trazodone 50mg  at  bedtme Current anticholinergic:  Meclizine 25mg   PAST MEDICAL HISTORY: Past Medical History:  Diagnosis Date  . Anemia   . Asthma   . Migraines     PAST SURGICAL HISTORY: Past Surgical History:  Procedure Laterality Date  . TONSILECTOMY, ADENOIDECTOMY, BILATERAL MYRINGOTOMY AND TUBES      MEDICATIONS: Current Outpatient Medications on File Prior to Visit  Medication Sig Dispense Refill  . DULoxetine (CYMBALTA) 30 MG capsule Take 1 capsule (30 mg total) by mouth daily. 90 capsule 3  . meclizine (ANTIVERT) 25 MG tablet Take 1 tablet (25 mg total) by mouth 3 (three) times daily as needed for dizziness or nausea. 30 tablet 3  . ondansetron (ZOFRAN ODT) 4 MG disintegrating tablet Take 1 tablet (4 mg total) by mouth every 8 (eight) hours as needed for nausea or vomiting. 20 tablet 0  . pantoprazole (PROTONIX) 20 MG tablet Take 1 tablet (20 mg total) by mouth daily. 30 tablet 3  . traZODone (DESYREL) 50 MG tablet Take 1 tablet (50 mg total) by mouth at bedtime. 30 tablet 2   No current facility-administered medications on file prior to visit.    ALLERGIES: No Known Allergies  FAMILY HISTORY: Family History  Problem Relation Age of Onset  . Hypertension Paternal Grandmother     SOCIAL HISTORY: Social History   Socioeconomic History  . Marital status: Single    Spouse name: Not on file  . Number of children: 0  . Years of education: Not on file  . Highest education level: Master's degree (  e.g., MA, MS, MEng, MEd, MSW, MBA)  Occupational History  . Not on file  Tobacco Use  . Smoking status: Current Every Day Smoker    Packs/day: 0.25    Years: 5.00    Pack years: 1.25    Types: Cigarettes  . Smokeless tobacco: Never Used  Vaping Use  . Vaping Use: Never used  Substance and Sexual Activity  . Alcohol use: Not Currently  . Drug use: Yes    Frequency: 1.0 times per week    Types: Marijuana  . Sexual activity: Yes    Birth control/protection: None  Other Topics  Concern  . Not on file  Social History Narrative  . Not on file   Social Determinants of Health   Financial Resource Strain:   . Difficulty of Paying Living Expenses: Not on file  Food Insecurity:   . Worried About Programme researcher, broadcasting/film/video in the Last Year: Not on file  . Ran Out of Food in the Last Year: Not on file  Transportation Needs:   . Lack of Transportation (Medical): Not on file  . Lack of Transportation (Non-Medical): Not on file  Physical Activity:   . Days of Exercise per Week: Not on file  . Minutes of Exercise per Session: Not on file  Stress:   . Feeling of Stress : Not on file  Social Connections:   . Frequency of Communication with Friends and Family: Not on file  . Frequency of Social Gatherings with Friends and Family: Not on file  . Attends Religious Services: Not on file  . Active Member of Clubs or Organizations: Not on file  . Attends Banker Meetings: Not on file  . Marital Status: Not on file  Intimate Partner Violence:   . Fear of Current or Ex-Partner: Not on file  . Emotionally Abused: Not on file  . Physically Abused: Not on file  . Sexually Abused: Not on file    PHYSICAL EXAM: Blood pressure 111/76, pulse 81, height 5\' 8"  (1.727 m), weight 154 lb (69.9 kg), SpO2 99 %. General: No acute distress.  Patient appears well-groomed.  Head:  Normocephalic/atraumatic Eyes:  fundi examined but not visualized Neck: supple, no paraspinal tenderness, full range of motion Back: No paraspinal tenderness Heart: regular rate and rhythm Lungs: Clear to auscultation bilaterally. Vascular: No carotid bruits. Neurological Exam: Mental status:  [employer] Mental Exam 06/13/2020  Weekday Correct 1  Current year 1  What state are we in? 1  Amount spent 1  Amount left 2  # of Animals 3  5 objects recall 2  Number series 2  Hour markers 2  Time correct 0  Placed X in triangle correctly 1  Largest Figure 1  Name of female 2  Date back  to work 2  Type of work 2  State she lived in 0  Total score 23   Cranial nerves: CN I: not tested CN II: pupils equal, round and reactive to light, visual fields intact CN III, IV, VI:  full range of motion, no nystagmus, no ptosis CN V: facial sensation intact CN VII: upper and lower face symmetric CN VIII: hearing intact CN IX, X: gag intact, uvula midline CN XI: sternocleidomastoid and trapezius muscles intact CN XII: tongue midline Bulk & Tone: normal, no fasciculations. Motor:  5/5 throughout  Sensation:  Pinprick and vibration sensation intact. Deep Tendon Reflexes:  2+ throughout, toes downgoing.  Finger to nose testing:  Without dysmetria.  Heel to  shin:  Without dysmetria.  Gait:  Normal station and stride.  Able to turn and tandem walk. Romberg negative.  IMPRESSION: Postconcussion syndrome.  Much improved but still with residual symptoms.  PLAN: 1.  Continue Cymbalta 30mg  daily.  If no improvement in 3 weeks, she will contact me and we can increase dose to 40mg  daily 2.  Limit use of pain relievers to no more than 2 days out of week to prevent risk of rebound or medication-overuse headache. 3.  Use melatonin to help sleep.  May use trazodone PRN. 4.  Use blue light glasses when working on the computer 5.  Follow up in 4 months.  Thank you for allowing me to take part in the care of this patient.  , DO  CC:  , MD  Shon Millet, MD

## 2020-07-26 ENCOUNTER — Ambulatory Visit: Payer: No Typology Code available for payment source | Admitting: Neurology

## 2020-09-05 IMAGING — US ULTRASOUND RIGHT BREAST LIMITED
1 series · 6 of 6 positions shown · non-contrast
Comparison: None.

CLINICAL DATA: 28-year-old female with a palpable right breast lump
for approximately 9 years. No significant change.

EXAM:
ULTRASOUND OF THE RIGHT BREAST

[Series 1: ultrasound right breast limited · 0.06mm/px · 6 of 6 slices shown]
[im 1/6]
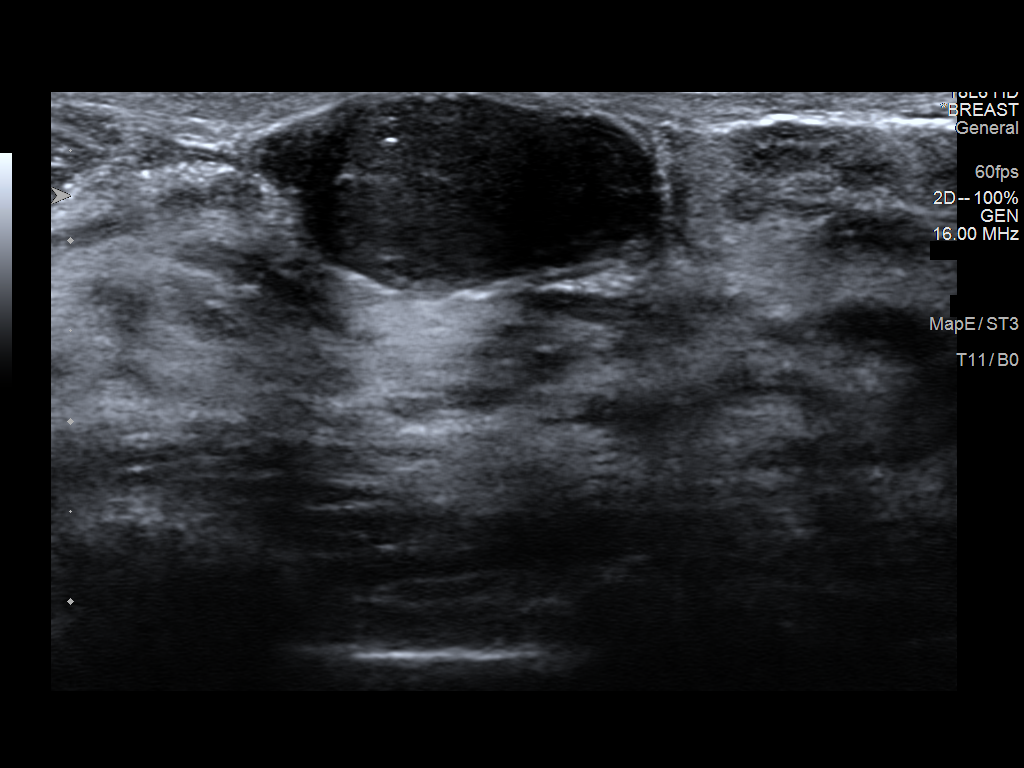
[im 2/6]
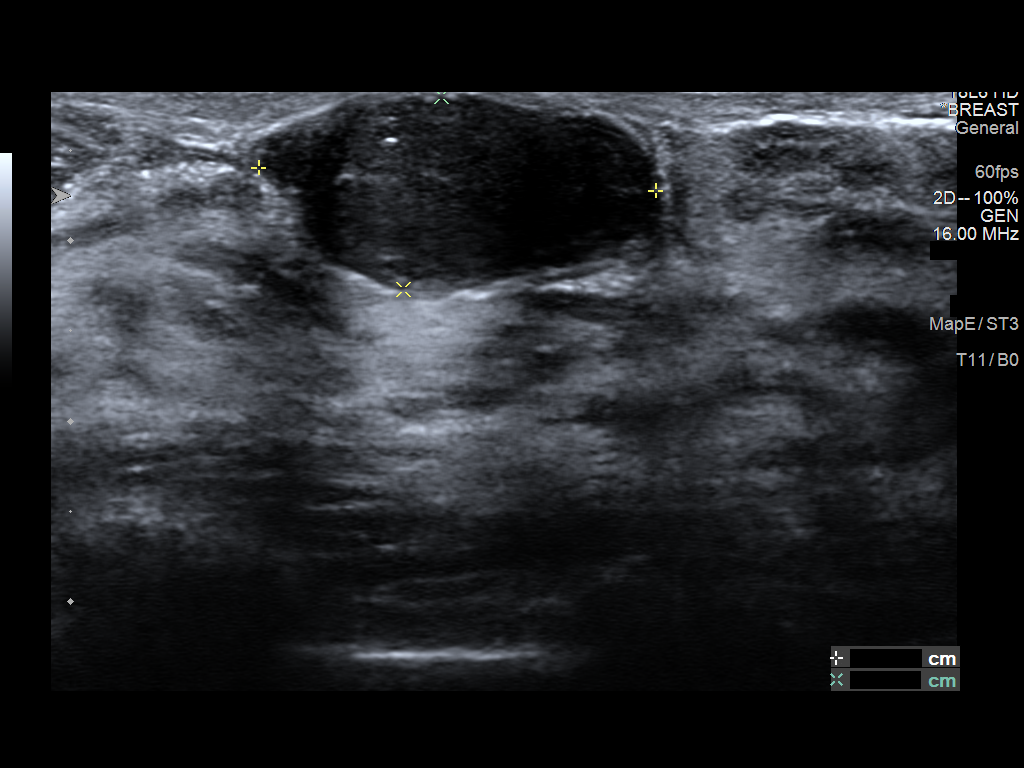
[im 3/6]
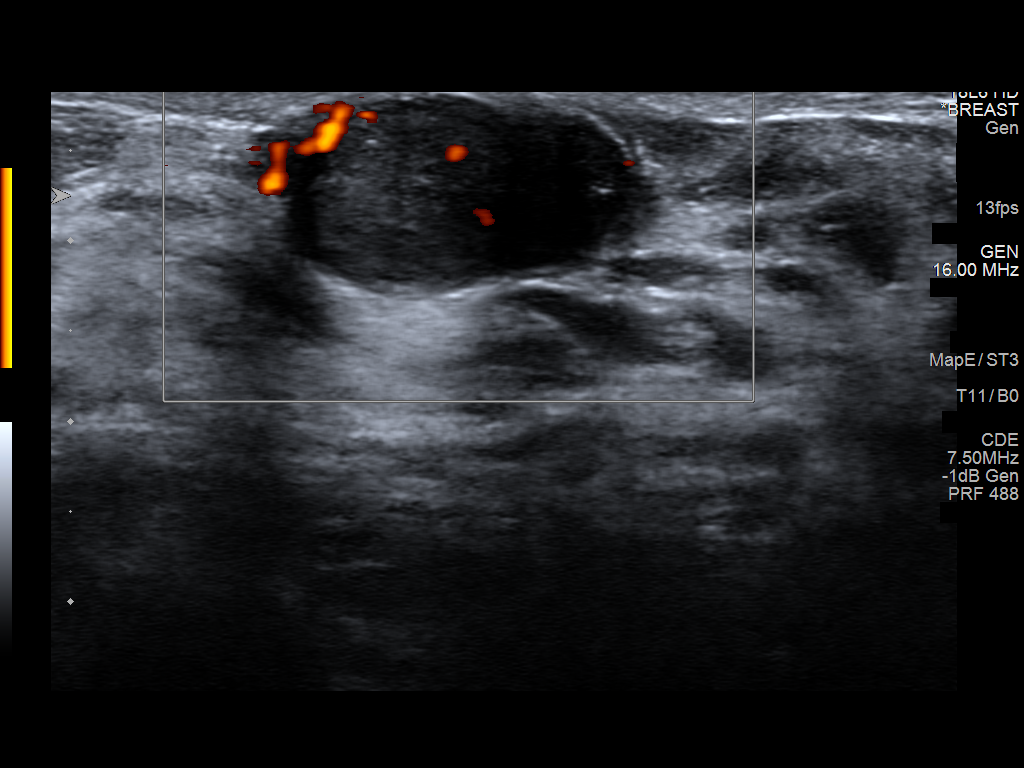
[im 4/6]
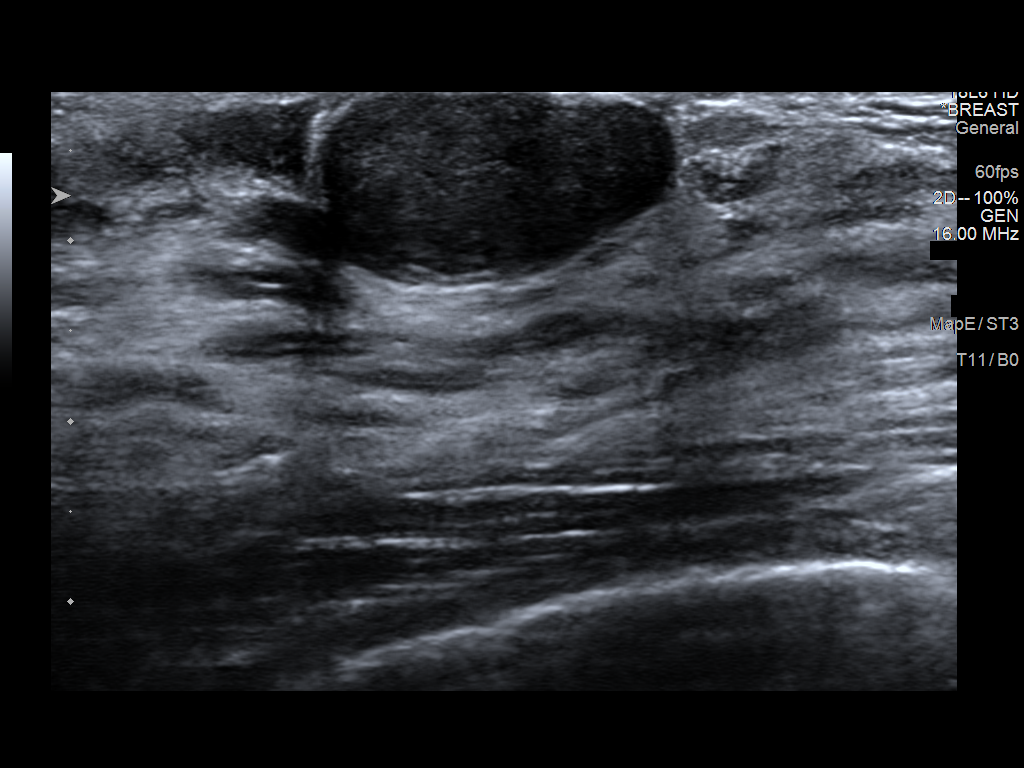
[im 5/6]
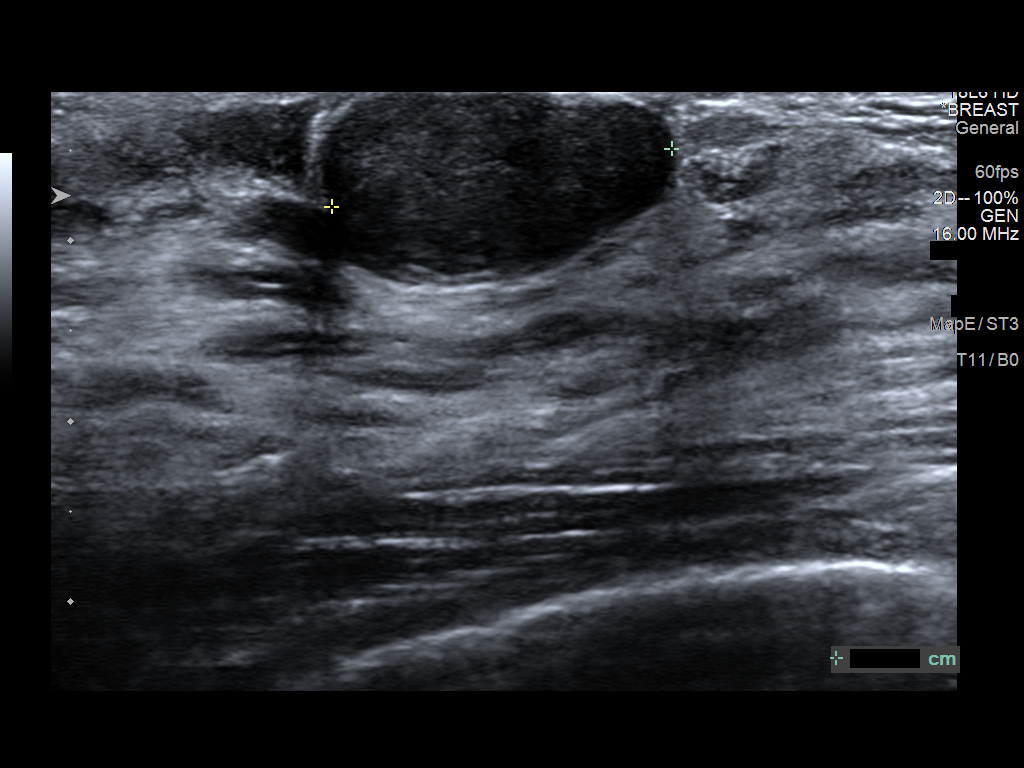
[im 6/6]
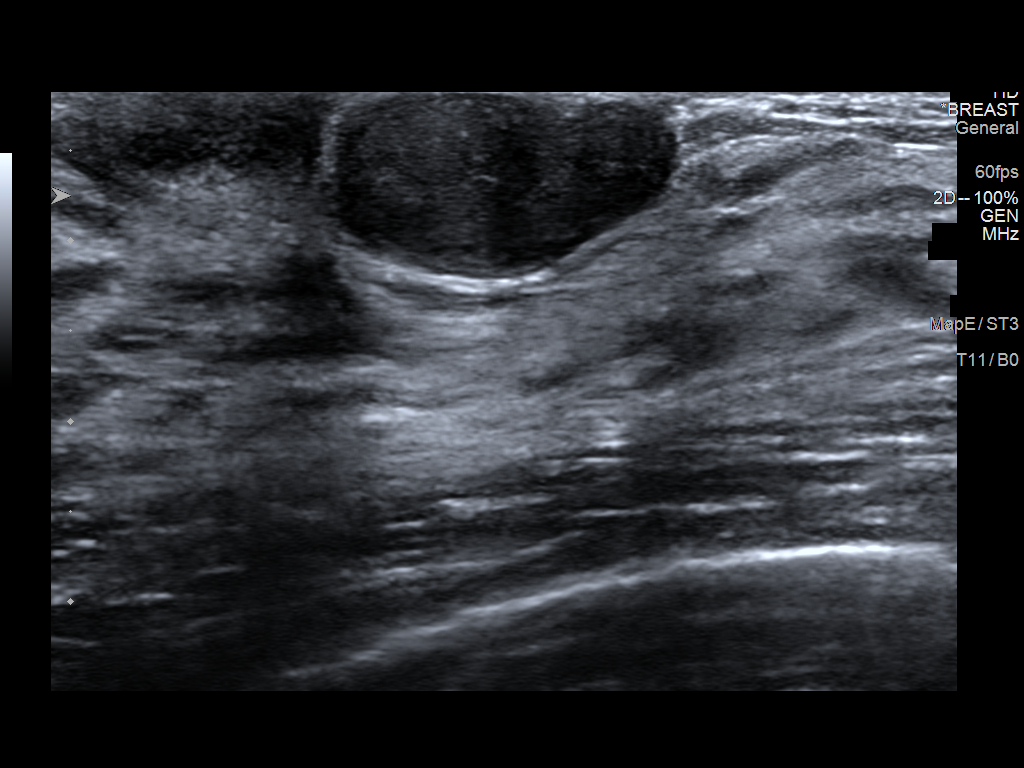

[6 of 6 positions shown; findings below may reference images not displayed]

FINDINGS: Targeted ultrasound is performed, showing an oval, circumscribed
hypoechoic mass at the 4 o'clock position 1 cm from the nipple. It
measures 2.2 x 1.9 x 1.1 cm. There is associated vascularity.
IMPRESSION: Probably benign, probable right breast fibroadenoma corresponding
with the patient's palpable abnormality. Recommend short-term
imaging follow-up.

RECOMMENDATION:
Right breast ultrasound in 6 months.

I have discussed the findings and recommendations with the patient.
Results were also provided in writing at the conclusion of the
visit. If applicable, a reminder letter will be sent to the patient
regarding the next appointment.

BI-RADS CATEGORY  3: Probably benign.

## 2020-10-11 NOTE — Progress Notes (Signed)
NEUROLOGY FOLLOW UP OFFICE NOTE  Lindsey Duran [id number]   Subjective:  Lindsey Duran is a 29 year old female who follows up for postconcussion syndrome.  UPDATE: Symptoms fluctuate.  Stimulation aggravates it.  If gets up too quickly, she feels dizzy.  Bright lights cause photosensitivity.  She has irritability.  If she feels stressed, she may get right sided pressure headache.  Headache frequency variable depending on stress level.  The are moderate intensity.  On average once a week.  But two weeks ago, they were daily.  They last 20-30 minutes.  She feels a little sluggish in the morning with Cymbalta.  She works part-time, 4 days a week.  She has 2 sessions on Monday, 3 sessions on Tuesday, 3 sessions on Wednesday and 4 sessions on Thursday.    Current NSAIDs:  none Current analgesics:  Tylenol Current anti-emetic:  Zofran ODT 4mg  Current sleep medication:  Trazodone 50mg  at bedtime PRN Current antidepressant:  Cymbalta 30mg  daily, trazodone 50mg  at bedtme Current anticholinergic:  Meclizine 25mg   HISTORY: On 12/04/2019, she sustained a concussion in when a piece of ice fell off a roof and hit her on the head.  She did not lose consciousness but developed headache, dizziness and nausea.  She was evaluated at a local ED where she had a CT of the head that revealed no acute abnormalities.  She continued to have headache, nausea, dizziness, difficulty with concentration and irritability, so she went to the [facility] ED for further evaluation.  Exam revealed no concerning findings.  She followed up at the concussion clinic where she underwent physical therapy.  She was started on Cymbalta.    Overall, she has improved but still has sensitivity to light.  If she feels excited or overwhelmed, she may get a headache.  Now it is a mild right frontal pulsation that is now infrequent.  It's no longer painful but it is uncomfortable.  She gets some dizziness, described as a  lightheadedness, with change in position or getting up.  She still feels irritated but mood swings is much improved.  Sleep improved but has trazodone as needed.  Unfortunately, it causes nightmares.  Nausea improved overall.  Sometimes still has ringing in her right ear.    She is a .  She was out of work until May.  She has a limited schedule, seeing about 3 patients a day.  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     PAST MEDICAL HISTORY: Past Medical History:  Diagnosis Date  . Anemia   . Asthma   . Migraines     MEDICATIONS: Current Outpatient Medications on File Prior to Visit  Medication Sig Dispense Refill  . DULoxetine (CYMBALTA) 30 MG capsule Take 1 capsule (30 mg total) by mouth daily. 90 capsule 3  . meclizine (ANTIVERT) 25 MG tablet Take 1 tablet (25 mg total) by mouth 3 (three) times daily as needed for dizziness or nausea. 30 tablet 3  . ondansetron (ZOFRAN ODT) 4 MG disintegrating tablet Take 1 tablet (4 mg total) by mouth every 8 (eight) hours as needed for nausea or vomiting. 20 tablet 0  . pantoprazole (PROTONIX) 20 MG tablet Take 1 tablet (20 mg total) by mouth daily. (Patient taking differently: Take 20 mg by mouth daily. As needed) 30 tablet 3  . traZODone (DESYREL) 50 MG tablet Take 1 tablet (50 mg total) by mouth at bedtime. (Patient taking differently: Take 50 mg by mouth at bedtime. As  needed) 30 tablet 2   No current facility-administered medications on file prior to visit.    ALLERGIES: No Known Allergies  FAMILY HISTORY: Family History  Problem Relation Age of Onset  . Hypertension Paternal Grandmother   . Diabetes gravidarum Other   . High blood pressure Other     SOCIAL HISTORY: Social History   Socioeconomic History  . Marital status: Single    Spouse name: Not on file  . Number of children: 0  . Years of education: Not on file  . Highest education level: Master's degree (e.g., MA, MS, MEng, MEd, MSW, MBA)  Occupational  History  . Not on file  Tobacco Use  . Smoking status: Former Smoker    Packs/day: 0.25    Years: 5.00    Pack years: 1.25    Types: Cigarettes  . Smokeless tobacco: Never Used  Vaping Use  . Vaping Use: Never used  Substance and Sexual Activity  . Alcohol use: Not Currently  . Drug use: Yes    Frequency: 1.0 times per week    Types: Marijuana  . Sexual activity: Yes    Birth control/protection: None  Other Topics Concern  . Not on file  Social History Narrative  . Not on file   Social Determinants of Health   Financial Resource Strain: Not on file  Food Insecurity: Not on file  Transportation Needs: Not on file  Physical Activity: Not on file  Stress: Not on file  Social Connections: Not on file  Intimate Partner Violence: Not on file     Objective:  Blood pressure 131/73, pulse 73, height 5\' 8"  (1.727 m), weight 151 lb (68.5 kg), SpO2 97 %. General: No acute distress.  Patient appears well-groomed.     Assessment/Plan:   Postconcussion syndrome improved but still with headache and irritability/anxiety.  I think treatment of anxiety/irritability will further reduce headaches.  Cognitive behavioral therapy would be a good idea.  1.  Referral to [facility] for cognitive behavioral therapy 2.  She will try increasing Cymbalta to 40mg  in the evening.  If no improvement in 2 months, she will contact me. 3.  Meclizine as needed for dizziness but sparingly 4.  Follow up 4 to 6 months.  [facility], DO  CC: , MD

## 2020-10-13 ENCOUNTER — Other Ambulatory Visit: Payer: Self-pay

## 2020-10-13 ENCOUNTER — Encounter: Payer: Self-pay | Admitting: Neurology

## 2020-10-13 ENCOUNTER — Ambulatory Visit (INDEPENDENT_AMBULATORY_CARE_PROVIDER_SITE_OTHER): Payer: Commercial Managed Care - PPO | Admitting: Neurology

## 2020-10-13 VITALS — BP 131/73 | HR 73 | Ht 68.0 in | Wt 151.0 lb

## 2020-10-13 DIAGNOSIS — F0781 Postconcussional syndrome: Secondary | ICD-10-CM

## 2020-10-13 DIAGNOSIS — G44329 Chronic post-traumatic headache, not intractable: Secondary | ICD-10-CM | POA: Diagnosis not present

## 2020-10-13 DIAGNOSIS — F419 Anxiety disorder, unspecified: Secondary | ICD-10-CM | POA: Diagnosis not present

## 2020-10-13 NOTE — Patient Instructions (Signed)
1.  Increase duloxetine to 40mg  at night (6 PM).  If no improvement in 2 months, contact me. 2.  Will refer to cognitive behavioral therapy 3.  Meclizine for dizziness but use SPARINGLY 4.  Follow up 4 to 6 months.

## 2021-03-27 NOTE — Progress Notes (Signed)
NEUROLOGY FOLLOW UP OFFICE NOTE  Lindsey Duran [id number]  Assessment/Plan:   1.  Posttraumatic headache 2.  Anxiety  1.  Increase Cymbalta to 60mg  daily 2.  Refer again to [facility] for CBT. 3.  Follow up 6 months.  Subjective:  Lindsey Duran is a 30 year old female who follows up for postconcussion syndrome.  UPDATE: Increased Cymbalta to 40mg  QHS in December.  However, she only had been taking 30mg .  I had referred her to [facility] for CBT, however her insurance wouldn't cover it.    She reports symptoms aren't frequent.  Usually it is now occurs if she is "overloaded", such as high-intensity/high-stress situations/feeling overwhelmed and significant movement may aggravate headache and mood swings are infrequent.  Symptoms resolve when she calms down.  Overall, work is manageable.  She reports feeling dizzy briefly when performing activities when she bending, such as grabbing for a pan under the sink, or if she gets up too fast.  Current NSAIDs:none Current analgesics:Tylenol Current anti-emetic: Zofran ODT 4mg  Current antidepressant: Cymbalta 40mg  daily, trazodone 50mg  at bedtme Current anticholinergic: Meclizine 25mg   HISTORY: On 12/04/2019, she sustained a concussion in when a piece of ice fell off a roof and hit her on the head. She did not lose consciousness but developed headache, dizziness and nausea. She was evaluated at a local ED where she had a CT of the head that revealed no acute abnormalities. She continued to have headache, nausea, dizziness, difficulty with concentration and irritability, so she went to the [facility] ED for further evaluation. Exam revealed no concerning findings. She followed up at the concussion clinic where she underwent physical therapy. She was started on Cymbalta.   Overall, she has improved but still has sensitivity to light. If she feels excited or overwhelmed, she  may get a headache. Now it is a mild right frontal pulsation that is now infrequent. It's no longer painful but it is uncomfortable. She gets some dizziness, described as a lightheadedness, with change in position or getting up. She still feels irritated but mood swings is much improved. Sleep improved but has trazodone as needed. Unfortunately, it causes nightmares. Nausea improved overall. Sometimes still has ringing in her right ear. She was seen in the concussion clinic.  She underwent vestibular rehab which was helpful.    She is a . She was out of work until May. She has a limited schedule, seeing about 3 patients a day. 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   Past medications:  Trazodone.  PAST MEDICAL HISTORY: Past Medical History:  Diagnosis Date  . Anemia   . Asthma   . Migraines     MEDICATIONS: Current Outpatient Medications on File Prior to Visit  Medication Sig Dispense Refill  . DULoxetine (CYMBALTA) 30 MG capsule Take 1 capsule (30 mg total) by mouth daily. 90 capsule 3  . meclizine (ANTIVERT) 25 MG tablet Take 1 tablet (25 mg total) by mouth 3 (three) times daily as needed for dizziness or nausea. 30 tablet 3  . ondansetron (ZOFRAN ODT) 4 MG disintegrating tablet Take 1 tablet (4 mg total) by mouth every 8 (eight) hours as needed for nausea or vomiting. 20 tablet 0  . pantoprazole (PROTONIX) 20 MG tablet Take 1 tablet (20 mg total) by mouth daily. (Patient taking differently: Take 20 mg by mouth daily. As needed) 30 tablet 3  . traZODone (DESYREL) 50 MG tablet Take 1 tablet (50 mg total) by  mouth at bedtime. (Patient taking differently: Take 50 mg by mouth at bedtime. As needed) 30 tablet 2   No current facility-administered medications on file prior to visit.    ALLERGIES: No Known Allergies  FAMILY HISTORY: Family History  Problem Relation Age of Onset  . Hypertension Paternal Grandmother   . Diabetes gravidarum Other   . High  blood pressure Other       Objective:  Blood pressure 112/71, pulse 87, height 5\' 8"  (1.727 m), weight 136 lb 9.6 oz (62 kg), SpO2 97 %. General: No acute distress.  Patient appears well-groomed.   Head:  Normocephalic/atraumatic Eyes:  Fundi examined but not visualized Neck: supple, no paraspinal tenderness, full range of motion Heart:  Regular rate and rhythm Lungs:  Clear to auscultation bilaterally Back: No paraspinal tenderness Neurological Exam: alert and oriented to person, place, and time.  Speech fluent and not dysarthric, language intact.  CN II-XII intact. Bulk and tone normal, muscle strength 5/5 throughout.  Sensation to light touch intact.  Deep tendon reflexes 2+ throughout.  Finger to nose testing intact.  Gait normal, Romberg negative.   , DO  CC: Shon Millet, MD

## 2021-03-28 ENCOUNTER — Other Ambulatory Visit: Payer: Self-pay

## 2021-03-28 ENCOUNTER — Ambulatory Visit (INDEPENDENT_AMBULATORY_CARE_PROVIDER_SITE_OTHER): Payer: No Typology Code available for payment source | Admitting: Neurology

## 2021-03-28 ENCOUNTER — Encounter: Payer: Self-pay | Admitting: Neurology

## 2021-03-28 VITALS — BP 112/71 | HR 87 | Ht 68.0 in | Wt 136.6 lb

## 2021-03-28 DIAGNOSIS — F4329 Adjustment disorder with other symptoms: Secondary | ICD-10-CM | POA: Diagnosis not present

## 2021-03-28 DIAGNOSIS — G44329 Chronic post-traumatic headache, not intractable: Secondary | ICD-10-CM

## 2021-03-28 MED ORDER — DULOXETINE HCL 60 MG PO CPEP: 60.0000 mg | ORAL_CAPSULE | Freq: Every day | ORAL | 5 refills | Status: DC

## 2021-03-28 NOTE — Patient Instructions (Signed)
1.  I have increased duloxetine to 60mg  daily 2.  Will refer you again to [facility] to see a therapist and possibly cognitive behavioral therapy 3.  Follow up in 6 months.

## 2021-05-08 ENCOUNTER — Encounter: Payer: Self-pay | Admitting: Family Medicine

## 2021-05-08 ENCOUNTER — Other Ambulatory Visit: Payer: Self-pay

## 2021-05-08 ENCOUNTER — Ambulatory Visit (INDEPENDENT_AMBULATORY_CARE_PROVIDER_SITE_OTHER): Payer: No Typology Code available for payment source | Admitting: Family Medicine

## 2021-05-08 VITALS — BP 118/66 | HR 86 | Temp 98.0°F | Wt 141.4 lb

## 2021-05-08 DIAGNOSIS — R58 Hemorrhage, not elsewhere classified: Secondary | ICD-10-CM | POA: Diagnosis not present

## 2021-05-08 NOTE — Progress Notes (Signed)
Subjective:    Patient ID: Lindsey Duran, female    DOB: 02-06-1991, 30 y.o.   MRN: [id number]  Chief Complaint  Patient presents with   Bleeding/Bruising    Bruise on left leg, couple days ago    HPI Patient was seen today for acute concern.  Pt noticed an area of ecchymosis on L shin.  Injury, bumping into anything, new medications or OTC supplements, SOB, CP, calf pain, LE edema, or recent trips.  Patient just wanted to make sure she did not have a blood clot.  Has not tried anything for her symptoms.  Patient notes an area of faint ecchymosis inferior to the left knee present x years.  Past Medical History:  Diagnosis Date   Anemia    Asthma    Migraines     No Known Allergies  ROS General: Denies fever, chills, night sweats, changes in weight, changes in appetite HEENT: Denies headaches, ear pain, changes in vision, rhinorrhea, sore throat CV: Denies CP, palpitations, SOB, orthopnea Pulm: Denies SOB, cough, wheezing GI: Denies abdominal pain, nausea, vomiting, diarrhea, constipation GU: Denies dysuria, hematuria, frequency, vaginal discharge Msk: Denies muscle cramps, joint pains Neuro: Denies weakness, numbness, tingling Skin: Denies rashes + bruise on left shin Psych: Denies depression, anxiety, hallucinations     Objective:    Blood pressure 118/66, pulse 86, temperature 98 F (36.7 C), temperature source Oral, weight 141 lb 6.4 oz (64.1 kg), SpO2 96 %.  Gen. Pleasant, well-nourished, in no distress, normal affect   HEENT: [place]/AT, face symmetric, conjunctiva clear, no scleral icterus, PERRLA, EOMI, nares patent without drainage Lungs: no accessory muscle use Cardiovascular: RRR,no peripheral edema Musculoskeletal: No deformities, no cyanosis or clubbing, normal tone Neuro:  A&Ox3, CN II-XII intact, normal gait Skin:  Warm, no lesions/ rash.  Faint area of ecchymosis on left shin with a slightly darker area of ecchymosis inferior to that area.   Wt Readings  from Last 3 Encounters:  05/08/21 141 lb 6.4 oz (64.1 kg)  03/28/21 136 lb 9.6 oz (62 kg)  10/13/20 151 lb (68.5 kg)    Lab Results  Component Value Date   WBC 3.5 (L) 02/11/2020   HGB 13.4 02/11/2020   HCT 40.3 02/11/2020   PLT 222.0 02/11/2020   GLUCOSE 59 (L) 02/11/2020   CHOL 180 02/11/2020   TRIG 46.0 02/11/2020   HDL 86.80 02/11/2020   LDLCALC 84 02/11/2020   ALT 13 02/11/2020   AST 15 02/11/2020   NA 140 02/11/2020   K 4.4 02/11/2020   CL 103 02/11/2020   CREATININE 0.89 02/11/2020   BUN 10 02/11/2020   CO2 31 02/11/2020   TSH 1.05 02/11/2020   HGBA1C 5.3 02/11/2020    Assessment/Plan:  Ecchymosis -Discussed possible causes -Supportive care including heat, massage, elevation, NSAIDs as needed -Continue to monitor -For recurrent ecchymosis will obtain labs next week at CPE.  F/u as needed.  CPE scheduled next week.  Abbe Amsterdam, MD

## 2021-05-15 ENCOUNTER — Other Ambulatory Visit: Payer: Self-pay

## 2021-05-15 ENCOUNTER — Ambulatory Visit (INDEPENDENT_AMBULATORY_CARE_PROVIDER_SITE_OTHER): Payer: No Typology Code available for payment source | Admitting: Family Medicine

## 2021-05-15 ENCOUNTER — Encounter: Payer: Self-pay | Admitting: Family Medicine

## 2021-05-15 VITALS — BP 100/68 | HR 60 | Temp 98.2°F | Ht 69.0 in | Wt 141.0 lb

## 2021-05-15 DIAGNOSIS — R1032 Left lower quadrant pain: Secondary | ICD-10-CM | POA: Diagnosis not present

## 2021-05-15 DIAGNOSIS — R198 Other specified symptoms and signs involving the digestive system and abdomen: Secondary | ICD-10-CM

## 2021-05-15 DIAGNOSIS — Z13220 Encounter for screening for lipoid disorders: Secondary | ICD-10-CM | POA: Diagnosis not present

## 2021-05-15 DIAGNOSIS — Z0000 Encounter for general adult medical examination without abnormal findings: Secondary | ICD-10-CM | POA: Diagnosis not present

## 2021-05-15 DIAGNOSIS — Z113 Encounter for screening for infections with a predominantly sexual mode of transmission: Secondary | ICD-10-CM

## 2021-05-15 LAB — POCT URINALYSIS DIPSTICK
Bilirubin, UA: NEGATIVE
Blood, UA: NEGATIVE
Glucose, UA: NEGATIVE
Ketones, UA: NEGATIVE
Leukocytes, UA: NEGATIVE
Nitrite, UA: NEGATIVE
Protein, UA: NEGATIVE
Spec Grav, UA: >=1.03 — AB (ref 1.010–1.025)
Urobilinogen, UA: NEGATIVE E.U./dL — AB
pH, UA: 6 (ref 5.0–8.0)

## 2021-05-15 LAB — COMPREHENSIVE METABOLIC PANEL
ALT: 7 U/L (ref 0–35)
AST: 12 U/L (ref 0–37)
Albumin: 4.1 g/dL (ref 3.5–5.2)
Alkaline Phosphatase: 49 U/L (ref 39–117)
BUN: 10 mg/dL (ref 6–23)
CO2: 26 mEq/L (ref 19–32)
Calcium: 9.2 mg/dL (ref 8.4–10.5)
Chloride: 105 mEq/L (ref 96–112)
Creatinine, Ser: 0.96 mg/dL (ref 0.40–1.20)
GFR: 79.99 mL/min (ref >60.00)
Glucose, Bld: 88 mg/dL (ref 70–99)
Potassium: 4.1 mEq/L (ref 3.5–5.1)
Sodium: 138 mEq/L (ref 135–145)
Total Bilirubin: 0.7 mg/dL (ref 0.2–1.2)
Total Protein: 7 g/dL (ref 6.0–8.3)

## 2021-05-15 LAB — CBC WITH DIFFERENTIAL/PLATELET
Basophils Absolute: 0 10*3/uL (ref 0.0–0.1)
Basophils Relative: 1.2 % (ref 0.0–3.0)
Eosinophils Absolute: 0.1 10*3/uL (ref 0.0–0.7)
Eosinophils Relative: 1.9 % (ref 0.0–5.0)
HCT: 39.2 % (ref 36.0–46.0)
Hemoglobin: 13.1 g/dL (ref 12.0–15.0)
Lymphocytes Relative: 44.9 % (ref 12.0–46.0)
Lymphs Abs: 1.5 10*3/uL (ref 0.7–4.0)
MCHC: 33.5 g/dL (ref 30.0–36.0)
MCV: 93.4 fl (ref 78.0–100.0)
Monocytes Absolute: 0.3 10*3/uL (ref 0.1–1.0)
Monocytes Relative: 9.2 % (ref 3.0–12.0)
Neutro Abs: 1.4 10*3/uL (ref 1.4–7.7)
Neutrophils Relative %: 42.8 % — ABNORMAL LOW (ref 43.0–77.0)
Platelets: 223 10*3/uL (ref 150.0–400.0)
RBC: 4.19 Mil/uL (ref 3.87–5.11)
RDW: 13.2 % (ref 11.5–15.5)
WBC: 3.3 10*3/uL — ABNORMAL LOW (ref 4.0–10.5)

## 2021-05-15 LAB — LIPID PANEL
Cholesterol: 164 mg/dL (ref 0–200)
HDL: 90.5 mg/dL (ref >39.00)
LDL Cholesterol: 65 mg/dL (ref 0–99)
NonHDL: 73.36
Total CHOL/HDL Ratio: 2
Triglycerides: 43 mg/dL (ref 0.0–149.0)
VLDL: 8.6 mg/dL (ref 0.0–40.0)

## 2021-05-15 LAB — T4, FREE: Free T4: 0.79 ng/dL (ref 0.60–1.60)

## 2021-05-15 LAB — TSH: TSH: 1.16 u[IU]/mL (ref 0.35–5.50)

## 2021-05-15 LAB — HEMOGLOBIN A1C: Hgb A1c MFr Bld: 5.4 % (ref 4.6–6.5)

## 2021-05-15 NOTE — Progress Notes (Signed)
Subjective:     Lindsey Duran is a 30 y.o. female and is here for a comprehensive physical exam. The patient reports pelvic fullness, occasionally strains when having a BM, and increased urination at night.  Denies heavy menses, cramping, d/c. Sleep, mood, energy good for the most part.  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  Social History   Socioeconomic History   Marital status: Single    Spouse name: Not on file   Number of children: 0   Years of education: Not on file   Highest education level: Master's degree (e.g., MA, MS, MEng, MEd, MSW, MBA)  Occupational History   Not on file  Tobacco Use   Smoking status: Former    Packs/day: 0.25    Years: 5.00    Pack years: 1.25    Types: Cigarettes   Smokeless tobacco: Never  Vaping Use   Vaping Use: Never used  Substance and Sexual Activity   Alcohol use: Not Currently   Drug use: Yes    Frequency: 1.0 times per week    Types: Marijuana   Sexual activity: Yes    Birth control/protection: None  Other Topics Concern   Not on file  Social History Narrative   Not on file   Social Determinants of Health   Financial Resource Strain: Not on file  Food Insecurity: Not on file  Transportation Needs: Not on file  Physical Activity: Not on file  Stress: Not on file  Social Connections: Not on file  Intimate Partner Violence: Not on file   Health Maintenance  Topic Date Due   COVID-19 Vaccine (1) Never done   Pneumococcal Vaccine 29-52 Years old (1 - PCV) Never done   HIV Screening  Never done   Hepatitis C Screening  Never done   TETANUS/TDAP  Never done   PAP-Cervical Cytology Screening  10/06/2021 (Originally 12/03/2012)   PAP SMEAR-Modifier  10/06/2021 (Originally 12/03/2012)   INFLUENZA VACCINE  05/22/2021   HPV VACCINES  Aged Out    The following portions of the patient's history were reviewed and updated as appropriate: allergies, current  medications, past family history, past medical history, past social history, past surgical history, and problem list.  Review of Systems Pertinent items noted in HPI and remainder of comprehensive ROS otherwise negative.   Objective:    BP 100/68 (BP Location: Right Arm, Patient Position: Sitting, Cuff Size: Normal)   Pulse 60   Temp 98.2 F (36.8 C) (Oral)   Ht 5\' 9"  (1.753 m)   Wt 141 lb (64 kg)   LMP 04/16/2021   SpO2 96%   BMI 20.82 kg/m  General appearance: alert, cooperative, and no distress Head: Normocephalic, without obvious abnormality, atraumatic Eyes: conjunctivae/corneas clear. PERRL, EOM's intact. Fundi benign. Ears: normal TM's and external ear canals both ears Nose: Nares normal. Septum midline. Mucosa normal. No drainage or sinus tenderness. Throat: lips, mucosa, and tongue normal; teeth and gums normal Neck: no adenopathy, no carotid bruit, no JVD, supple, symmetrical, trachea midline, and thyroid not enlarged, symmetric, no tenderness/mass/nodules Lungs: clear to auscultation bilaterally Heart: regular rate and rhythm, S1, S2 normal, no murmur, click, rub or gallop Abdomen:  BS present, soft, ND, TTP in LLQ, no hepatosplenomegaly. Extremities: extremities normal, atraumatic, no cyanosis or edema Pulses: 2+ and symmetric Skin: Skin color, texture, turgor normal. No rashes or lesions Lymph nodes: Cervical, supraclavicular, and axillary nodes normal. Neurologic: Alert and oriented X 3, normal  strength and tone. Normal symmetric reflexes. Normal coordination and gait    Assessment:    Healthy female exam with TTP of LLQ on exam.      Plan:    Anticipatory guidance given including wearing seatbelts, smoke detectors in the home, increasing physical activity, increasing p.o. intake of water and vegetables. -PHQ-9 score 9 -GAD-7 score 7 -obtain labs -last pap Nov 2019. -given handout -next CPE in 1 yr See After Visit Summary for Counseling Recommendations    LLQ pain -Obtain labs -For continued or worsening symptoms obtain ultrasound - Plan: CBC with Differential/Platelet, CMP, POCT urinalysis dipstick  Abdominal fullness -Discussed possible causes including constipation, fibroids, UTI -Increase p.o. intake of water and fluids, vegetables -Consider probiotic  - Plan: CMP, POCT urinalysis dipstick  Screening for cholesterol level  - Plan: Lipid panel  Routine screening for STI -Plan: RPR, HIV with reflex  F/u prn  Abbe Amsterdam, MD

## 2021-05-16 LAB — RPR: RPR Ser Ql: NONREACTIVE

## 2021-05-16 LAB — HIV ANTIBODY (ROUTINE TESTING W REFLEX): HIV 1&2 Ab, 4th Generation: NONREACTIVE

## 2021-09-18 ENCOUNTER — Ambulatory Visit: Payer: No Typology Code available for payment source | Admitting: Family Medicine

## 2021-09-22 ENCOUNTER — Other Ambulatory Visit: Payer: Self-pay

## 2021-09-22 ENCOUNTER — Ambulatory Visit: Payer: No Typology Code available for payment source | Admitting: Family Medicine

## 2021-09-22 ENCOUNTER — Other Ambulatory Visit: Payer: No Typology Code available for payment source

## 2021-09-22 DIAGNOSIS — D72819 Decreased white blood cell count, unspecified: Secondary | ICD-10-CM

## 2021-09-22 NOTE — Addendum Note (Signed)
Addended by: Kathreen Devoid on: 09/22/2021 02:22 PM   Modules accepted: Orders

## 2021-10-03 NOTE — Progress Notes (Signed)
NEUROLOGY FOLLOW UP OFFICE NOTE  Lindsey Duran [id number]  Assessment/Plan:   Posttraumatic headache overall resolved Anxiety - adjustment disorder   Advised her to take the duloxetine 60mg  every day Follow up 7 to 8 months.   Subjective:  Lindsey Duran is a 30 year old female who follows up for postconcussion syndrome.   UPDATE: Increased Cymbalta to 60mg  QHS in June.  However, she is only taking it as needed.  She only takes it when she feels a pulsating in the right temple and prevents onset of headaches and irritability.  It is situational and very infrequent.  She may still have some slight dizziness if she bends down (such as cleaning the floors) or if she gets up too fast.     Current NSAIDs:  none Current analgesics:  Tylenol Current anti-emetic:  Zofran ODT 4mg  Current antidepressant:  Cymbalta 60mg  daily, trazodone 50mg  at bedtme Current anticholinergic:  Meclizine 25mg    HISTORY: On 12/04/2019, she sustained a concussion in July when a piece of ice fell off a roof and hit her on the head.  She did not lose consciousness but developed headache, dizziness and nausea.  She was evaluated at a local ED where she had a CT of the head that revealed no acute abnormalities.  She continued to have headache, nausea, dizziness, difficulty with concentration and irritability, so she went to the [facility] ED for further evaluation.  Exam revealed no concerning findings.  She followed up at the concussion clinic where she underwent physical therapy.  She was started on Cymbalta.     Overall, she has improved but still has sensitivity to light.  If she feels excited or overwhelmed, she may get a headache.  Now it is a mild right frontal pulsation that is now infrequent.  It's no longer painful but it is uncomfortable.  She gets some dizziness, described as a lightheadedness, with change in position or getting up.  She still feels irritated but mood swings is much improved.   Sleep improved but has trazodone as needed.  Unfortunately, it causes nightmares.  Nausea improved overall.  Sometimes still has ringing in her right ear.  She was seen in the concussion clinic.  She underwent vestibular rehab which was helpful.     She is a .  She was out of work until May.  She has a limited schedule, seeing about 3 patients a day.  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     Past medications:  Trazodone.  PAST MEDICAL HISTORY: Past Medical History:  Diagnosis Date   Anemia    Asthma    Migraines     MEDICATIONS: Current Outpatient Medications on File Prior to Visit  Medication Sig Dispense Refill   DULoxetine (CYMBALTA) 60 MG capsule Take 1 capsule (60 mg total) by mouth daily. 30 capsule 5   meclizine (ANTIVERT) 25 MG tablet Take 1 tablet (25 mg total) by mouth 3 (three) times daily as needed for dizziness or nausea. 30 tablet 3   ondansetron (ZOFRAN ODT) 4 MG disintegrating tablet Take 1 tablet (4 mg total) by mouth every 8 (eight) hours as needed for nausea or vomiting. 20 tablet 0   pantoprazole (PROTONIX) 20 MG tablet Take 1 tablet (20 mg total) by mouth daily. (Patient taking differently: Take 20 mg by mouth daily. As needed) 30 tablet 3   No current facility-administered medications on file prior to visit.    ALLERGIES: No Known Allergies  FAMILY HISTORY:  Family History  Problem Relation Age of Onset   Hypertension Paternal Grandmother    Diabetes gravidarum Other    High blood pressure Other       Objective:  Blood pressure 115/66, pulse 69, height 5\' 8"  (1.727 m), weight 147 lb 12.8 oz (67 kg), SpO2 98 %. General: No acute distress.  Patient appears well-groomed.      , DO  CC: Shon Millet, MD

## 2021-10-04 ENCOUNTER — Other Ambulatory Visit: Payer: Self-pay

## 2021-10-04 ENCOUNTER — Ambulatory Visit (INDEPENDENT_AMBULATORY_CARE_PROVIDER_SITE_OTHER): Payer: No Typology Code available for payment source | Admitting: Neurology

## 2021-10-04 ENCOUNTER — Encounter: Payer: Self-pay | Admitting: Neurology

## 2021-10-04 VITALS — BP 115/66 | HR 69 | Ht 68.0 in | Wt 147.8 lb

## 2021-10-04 DIAGNOSIS — G44329 Chronic post-traumatic headache, not intractable: Secondary | ICD-10-CM

## 2021-10-04 DIAGNOSIS — F4329 Adjustment disorder with other symptoms: Secondary | ICD-10-CM | POA: Diagnosis not present

## 2021-10-04 MED ORDER — DULOXETINE HCL 60 MG PO CPEP: 60.0000 mg | ORAL_CAPSULE | Freq: Every day | ORAL | 5 refills | Status: DC

## 2021-10-04 NOTE — Patient Instructions (Signed)
Take the duloxetine 60mg  once every day Follow up 7 to 8 months

## 2022-02-15 ENCOUNTER — Ambulatory Visit
Admission: EM | Admit: 2022-02-15 | Discharge: 2022-02-15 | Disposition: A | Discharge status: Home / Self Care | Payer: No Typology Code available for payment source | Attending: Emergency Medicine | Admitting: Emergency Medicine

## 2022-02-15 DIAGNOSIS — J310 Chronic rhinitis: Secondary | ICD-10-CM

## 2022-02-15 DIAGNOSIS — J329 Chronic sinusitis, unspecified: Secondary | ICD-10-CM

## 2022-02-15 DIAGNOSIS — R058 Other specified cough: Secondary | ICD-10-CM

## 2022-02-15 DIAGNOSIS — J302 Other seasonal allergic rhinitis: Secondary | ICD-10-CM

## 2022-02-15 DIAGNOSIS — H6983 Other specified disorders of Eustachian tube, bilateral: Secondary | ICD-10-CM

## 2022-02-15 DIAGNOSIS — J4520 Mild intermittent asthma, uncomplicated: Secondary | ICD-10-CM

## 2022-02-15 MED ORDER — ALBUTEROL SULFATE HFA 108 (90 BASE) MCG/ACT IN AERS: 2.0000 | INHALATION_SPRAY | Freq: Four times a day (QID) | RESPIRATORY_TRACT | 1 refills | Status: AC | PRN

## 2022-02-15 MED ORDER — MONTELUKAST SODIUM 10 MG PO TABS: 10.0000 mg | ORAL_TABLET | Freq: Every day | ORAL | 2 refills | Status: AC

## 2022-02-15 MED ORDER — FLUTICASONE PROPIONATE 50 MCG/ACT NA SUSP: 1.0000 | Freq: Every day | NASAL | 1 refills | Status: AC

## 2022-02-15 MED ORDER — IBUPROFEN 400 MG PO TABS: 400.0000 mg | ORAL_TABLET | Freq: Three times a day (TID) | ORAL | 0 refills | Status: DC | PRN

## 2022-02-15 MED ORDER — FEXOFENADINE HCL 180 MG PO TABS: 180.0000 mg | ORAL_TABLET | Freq: Every day | ORAL | 1 refills | Status: AC

## 2022-02-15 NOTE — ED Provider Notes (Signed)
?[facility] ? ? ? ?CSN: [id number] ?Arrival date & time: 02/15/22  1354 ?  ? ?HISTORY  ? ?Chief Complaint  ?Patient presents with  ? Chills  ? Generalized Body Aches  ? Sore Throat  ? Cough  ? ?HPI ?Lindsey Duran is a 31 y.o. female. Pt c/o chills, body aches, cough, otalgia (bilateral ears) and sore throat for the past 5 days.  Patient endorses a history of allergies, states has been taking NyQuil at night and a cold and flu preparation during the day.  Patient states that both of these medications helped temporarily but then symptoms return completely.  Patient states she also has a history of asthma and is currently not using any asthma or allergy medications.  Patient denies known fever, states her cough is nonproductive and worse at night, states coughing is persistent sometimes causes her to feel little short of breath.  Patient denies hearing loss.  Patient denies difficulty with swallowing. ? ?The history is provided by the patient.  ?Past Medical History:  ?Diagnosis Date  ? Anemia   ? Asthma   ? Migraines   ? ?Patient Active Problem List  ? Diagnosis Date Noted  ? Post concussion syndrome 12/17/2019  ? History of asthma 12/17/2019  ? ?Past Surgical History:  ?Procedure Laterality Date  ? TONSILECTOMY, ADENOIDECTOMY, BILATERAL MYRINGOTOMY AND TUBES    ? ?OB History   ? ? Gravida  ?0  ? Para  ?0  ? Term  ?0  ? Preterm  ?0  ? AB  ?0  ? Living  ?0  ?  ? ? SAB  ?0  ? IAB  ?0  ? Ectopic  ?0  ? Multiple  ?0  ? Live Births  ?0  ?   ?  ?  ? ?Home Medications   ? ?Prior to Admission medications   ?Medication Sig Start Date End Date Taking? Authorizing Provider  ?albuterol (VENTOLIN HFA) 108 (90 Base) MCG/ACT inhaler Inhale 2 puffs into the lungs every 6 (six) hours as needed for wheezing or shortness of breath (Cough). 02/15/22  Yes Theadora RamaMorgan, Wilford Merryfield Scales, PA-C  ?fexofenadine (ALLEGRA) 180 MG tablet Take 1 tablet (180 mg total) by mouth daily. 02/15/22 08/14/22 Yes Theadora RamaMorgan, Cerita Rabelo Scales, PA-C   ?fluticasone (FLONASE) 50 MCG/ACT nasal spray Place 1 spray into both nostrils daily. Begin by using 2 sprays in each nare daily for 3 to 5 days, then decrease to 1 spray in each nare daily. 02/15/22  Yes Theadora RamaMorgan, Lyrik Dockstader Scales, PA-C  ?ibuprofen (ADVIL) 400 MG tablet Take 1 tablet (400 mg total) by mouth every 8 (eight) hours as needed for up to 30 doses. 02/15/22  Yes Theadora RamaMorgan, Sofhia Ulibarri Scales, PA-C  ?montelukast (SINGULAIR) 10 MG tablet Take 1 tablet (10 mg total) by mouth at bedtime. 02/15/22 05/16/22 Yes Theadora RamaMorgan, Tomoko Sandra Scales, PA-C  ?DULoxetine (CYMBALTA) 60 MG capsule Take 1 capsule (60 mg total) by mouth daily. 10/04/21   Drema DallasJaffe, Adam R, DO  ?meclizine (ANTIVERT) 25 MG tablet Take 1 tablet (25 mg total) by mouth 3 (three) times daily as needed for dizziness or nausea. 04/20/20   Rodolph Bongorey, Evan S, MD  ?ondansetron (ZOFRAN ODT) 4 MG disintegrating tablet Take 1 tablet (4 mg total) by mouth every 8 (eight) hours as needed for nausea or vomiting. 12/14/19   Rodolph Bongorey, Evan S, MD  ?pantoprazole (PROTONIX) 20 MG tablet Take 1 tablet (20 mg total) by mouth daily. ?Patient taking differently: Take 20 mg by mouth daily. As needed 02/11/20  Deeann Saint, MD  ? ?Family History ?Family History  ?Problem Relation Age of Onset  ? Hypertension Paternal Grandmother   ? Diabetes gravidarum Other   ? High blood pressure Other   ? ?Social History ?Social History  ? ?Tobacco Use  ? Smoking status: Former  ?  Packs/day: 0.25  ?  Years: 5.00  ?  Pack years: 1.25  ?  Types: Cigarettes  ? Smokeless tobacco: Never  ?Vaping Use  ? Vaping Use: Never used  ?Substance Use Topics  ? Alcohol use: Not Currently  ? Drug use: Yes  ?  Frequency: 1.0 times per week  ?  Types: Marijuana  ? ?Allergies   ?Patient has no known allergies. ? ?Review of Systems ?Review of Systems ?Pertinent findings noted in history of present illness.  ? ?Physical Exam ?Triage Vital Signs ?ED Triage Vitals  ?Enc Vitals Group  ?   BP 08/18/21 0827 (!) 147/82  ?   Pulse Rate  08/18/21 0827 72  ?   Resp 08/18/21 0827 18  ?   Temp 08/18/21 0827 98.3 ?F (36.8 ?C)  ?   Temp Source 08/18/21 0827 Oral  ?   SpO2 08/18/21 0827 98 %  ?   Weight --   ?   Height --   ?   Head Circumference --   ?   Peak Flow --   ?   Pain Score 08/18/21 0826 5  ?   Pain Loc --   ?   Pain Edu? --   ?   Excl. in GC? --   ?No data found. ? ?Updated Vital Signs ?BP 112/74 (BP Location: Right Arm)   Pulse 88   Temp 98 ?F (36.7 ?C) (Oral)   Resp 18   LMP 02/12/2022   SpO2 97%  ? ?Physical Exam ?Vitals and nursing note reviewed.  ?Constitutional:   ?   General: She is not in acute distress. ?   Appearance: Normal appearance. She is not ill-appearing.  ?HENT:  ?   Head: Normocephalic and atraumatic.  ?   Salivary Glands: Right salivary gland is not diffusely enlarged or tender. Left salivary gland is not diffusely enlarged or tender.  ?   Right Ear: Ear canal and external ear normal. No drainage. A middle ear effusion is present. There is no impacted cerumen. Tympanic membrane is bulging. Tympanic membrane is not injected or erythematous.  ?   Left Ear: Ear canal and external ear normal. No drainage. A middle ear effusion is present. There is no impacted cerumen. Tympanic membrane is bulging. Tympanic membrane is not injected or erythematous.  ?   Ears:  ?   Comments: Bilateral EACs normal, both TMs bulging with clear fluid ?   Nose: Rhinorrhea present. No nasal deformity, septal deviation, signs of injury, nasal tenderness, mucosal edema or congestion. Rhinorrhea is clear.  ?   Right Nostril: Occlusion present. No foreign body, epistaxis or septal hematoma.  ?   Left Nostril: Occlusion present. No foreign body, epistaxis or septal hematoma.  ?   Right Turbinates: Enlarged, swollen and pale.  ?   Left Turbinates: Enlarged, swollen and pale.  ?   Right Sinus: No maxillary sinus tenderness or frontal sinus tenderness.  ?   Left Sinus: No maxillary sinus tenderness or frontal sinus tenderness.  ?   Mouth/Throat:  ?    Lips: Pink. No lesions.  ?   Mouth: Mucous membranes are moist. No oral lesions.  ?   Pharynx:  Oropharynx is clear. Uvula midline. No posterior oropharyngeal erythema or uvula swelling.  ?   Tonsils: No tonsillar exudate. 0 on the right. 0 on the left.  ?   Comments: Postnasal drip ?Eyes:  ?   General: Lids are normal.     ?   Right eye: No discharge.     ?   Left eye: No discharge.  ?   Extraocular Movements: Extraocular movements intact.  ?   Conjunctiva/sclera: Conjunctivae normal.  ?   Right eye: Right conjunctiva is not injected.  ?   Left eye: Left conjunctiva is not injected.  ?Neck:  ?   Trachea: Trachea and phonation normal.  ?Cardiovascular:  ?   Rate and Rhythm: Normal rate and regular rhythm.  ?   Pulses: Normal pulses.  ?   Heart sounds: Normal heart sounds. No murmur heard. ?  No friction rub. No gallop.  ?Pulmonary:  ?   Effort: Pulmonary effort is normal. No accessory muscle usage, prolonged expiration or respiratory distress.  ?   Breath sounds: Normal breath sounds. No stridor, decreased air movement or transmitted upper airway sounds. No decreased breath sounds, wheezing, rhonchi or rales.  ?Chest:  ?   Chest wall: No tenderness.  ?Musculoskeletal:     ?   General: Normal range of motion.  ?   Cervical back: Normal range of motion and neck supple. Normal range of motion.  ?Lymphadenopathy:  ?   Cervical: No cervical adenopathy.  ?Skin: ?   General: Skin is warm and dry.  ?   Findings: No erythema or rash.  ?Neurological:  ?   General: No focal deficit present.  ?   Mental Status: She is alert and oriented to person, place, and time.  ?Psychiatric:     ?   Mood and Affect: Mood normal.     ?   Behavior: Behavior normal.  ? ? ?Visual Acuity ?Right Eye Distance:   ?Left Eye Distance:   ?Bilateral Distance:   ? ?Right Eye Near:   ?Left Eye Near:    ?Bilateral Near:    ? ?UC Couse / Diagnostics / Procedures:  ?  ?EKG ? ?Radiology ?No results found. ? ?Procedures ?Procedures (including critical care  time) ? ?UC Diagnoses / Final Clinical Impressions(s)   ?I have reviewed the triage vital signs and the nursing notes. ? ?Pertinent labs & imaging results that were available during my care of the patient were reviewed by

## 2022-02-15 NOTE — ED Triage Notes (Signed)
Pt c/o chills, body aches, cough, otalgia (bilateral ears) and sore throat since last Saturday.  ?

## 2022-02-15 NOTE — Discharge Instructions (Addendum)
Your symptoms and my physical exam findings are concerning for exacerbation of your underlying allergies.  It is important that you begin your allergy regimen now and are consistent with taking allergy medications exactly as prescribed.  Allergy medications are preventative and therefore only work well when they are taken daily, not "as needed". ?  ?Allegra (fexofenadine): This is an excellent second-generation antihistamine that helps to reduce respiratory inflammatory response to environmental allergens.  This medication is not known to cause daytime sleepiness so it can be taken in the daytime.  If you find that it does make you sleepy, please feel free to take it at bedtime. ?  ?Flonase (fluticasone): This is a steroid nasal spray that you use once daily, 1 spray in each nare.  This medication does not work well if you decide to use it only used as you feel you need to, it works best used on a daily basis.  After 3 to 5 days of use, you will notice significant reduction of the inflammation and mucus production that is currently being caused by exposure to allergens, whether seasonal or environmental.  The most common side effect of this medication is nosebleeds.  If you experience a nosebleed, please discontinue use for 1 week, then feel free to resume.  I have provided you with a prescription but you can also purchase this medication over-the-counter if your insurance will not cover it. ?  ?ProAir, Ventolin, Proventil (albuterol): This inhaled medication contains a short acting beta agonist bronchodilator.  This medication works on the smooth muscle that opens and constricts of your airways by relaxing the muscle.  The result of relaxation of the smooth muscle is increased air movement and improved work of breathing.  This is a short acting medication that can be used every 4-6 hours as needed for increased work of breathing, shortness of breath, wheezing and excessive coughing.  Your lung exam today was  excellent, you are not wheezing at all.  The reason I have provided you with a prescription is because of your history of asthma.  I recommend that you proactively begin using this inhaler twice daily to prevent yourself from develop any wheezing. ?  ?Singulair (montelukast): This is a mast cell stabilizer that works well with antihistamines and is often prescribed for patients who have asthma.  Mast cells are responsible for stimulating histamine production so you can imagine that if we can reduce the activity of your mast cells, then fewer histamines will be produced and inflammation caused by allergy exposure will be significantly reduced.  Because you have a history of asthma, I recommend that you take this medication at the same time you take your antihistamine. ? ?Advil, Motrin (ibuprofen): This is a good anti-inflammatory medication which not only addresses aches, pains but also significantly reduces soft tissue inflammation of the upper airways that causes sinus and nasal congestion as well as inflammation of the lower airways which makes you feel like your breathing is constricted or your cough feel tight.  I recommend that you take between 400 mg every 8 hours as needed.    ?  ?Not taking your allergy medications as prescribed can increase your risk of more frequent upper respiratory infections, lower respiratory disorders, skin reactions, and eye irritations that may or may not require the use of antibiotics and steroids and can result in loss of time at work, celebrations with family and friends as well as missed social opportunities. ?  ?If you find that you have not  had significant relief of your symptoms in the next 7 to 10 days, please follow-up with your primary care provider or return here to urgent care for repeat evaluation and further recommendations.  I strongly recommend that you continue Allegra, Flonase and Singulair through the end of June.  Feel free to discontinue Advil and reduce your  use of ProAir to as needed only as soon as you are out of this acute phase. ?  ?Thank you for visiting urgent care today.  We appreciate the opportunity to participate in your care. ? ?

## 2022-06-04 ENCOUNTER — Ambulatory Visit: Payer: Self-pay | Admitting: Neurology

## 2023-06-10 ENCOUNTER — Ambulatory Visit (INDEPENDENT_AMBULATORY_CARE_PROVIDER_SITE_OTHER): Payer: BC Managed Care – PPO | Admitting: Family Medicine

## 2023-06-10 ENCOUNTER — Encounter: Payer: Self-pay | Admitting: Family Medicine

## 2023-06-10 ENCOUNTER — Ambulatory Visit: Payer: BC Managed Care – PPO | Admitting: Family Medicine

## 2023-06-10 VITALS — BP 110/78 | HR 76 | Temp 98.3°F | Ht 68.0 in | Wt 153.8 lb

## 2023-06-10 DIAGNOSIS — L600 Ingrowing nail: Secondary | ICD-10-CM | POA: Diagnosis not present

## 2023-06-10 DIAGNOSIS — Z0001 Encounter for general adult medical examination with abnormal findings: Secondary | ICD-10-CM | POA: Diagnosis not present

## 2023-06-10 DIAGNOSIS — Z0000 Encounter for general adult medical examination without abnormal findings: Secondary | ICD-10-CM

## 2023-06-10 DIAGNOSIS — D72819 Decreased white blood cell count, unspecified: Secondary | ICD-10-CM | POA: Diagnosis not present

## 2023-06-10 DIAGNOSIS — Z113 Encounter for screening for infections with a predominantly sexual mode of transmission: Secondary | ICD-10-CM | POA: Diagnosis not present

## 2023-06-10 NOTE — Progress Notes (Signed)
Established Patient Office Visit   Subjective  Patient ID: Lindsey Duran, female    DOB: Jun 29, 1991  Age: 32 y.o. MRN: [id number]  No chief complaint on file.   Patient is a 32 year old female lost to follow-up who presents for CPE.  Pt states she has been doing well since last OFV.  Feeling much better after a long bout with postconcussion syndrome.  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  Patient planning on going back to school in Jan for her PsyD.  Patient mentions throbbing and left great toenail at night.  Pt often gets pedicures and they may cut the side of her nail.  Pt mentions still having a slightly swollen area on anterior L shin that has increased in size.  In the past patient had low WBC count.    Past Medical History:  Diagnosis Date   Anemia    Asthma    Migraines    Past Surgical History:  Procedure Laterality Date   TONSILECTOMY, ADENOIDECTOMY, BILATERAL MYRINGOTOMY AND TUBES     Social History   Tobacco Use   Smoking status: Former    Current packs/day: 0.25    Average packs/day: 0.3 packs/day for 5.0 years (1.3 ttl pk-yrs)    Types: Cigarettes   Smokeless tobacco: Never  Vaping Use   Vaping status: Never Used  Substance Use Topics   Alcohol use: Not Currently   Drug use: Yes    Frequency: 1.0 times per week    Types: Marijuana   Family History  Problem Relation Age of Onset   Hypertension Paternal Grandmother    Diabetes gravidarum Other    High blood pressure Other    No Known Allergies    ROS Negative unless stated above    Objective:     BP 110/78 (BP Location: Left Arm, Patient Position: Sitting, Cuff Size: Normal)   Pulse 76   Temp 98.3 F (36.8 C) (Oral)   Ht 5\' 8"  (1.727 m)   Wt 153 lb 12.8 oz (69.8 kg)   LMP 06/02/2023 (Exact Date)   SpO2 98%   BMI 23.39 kg/m  BP Readings from Last 3 Encounters:  06/10/23 110/78  02/15/22 112/74  10/04/21 115/66   Wt Readings from Last 3 Encounters:   06/10/23 153 lb 12.8 oz (69.8 kg)  10/04/21 147 lb 12.8 oz (67 kg)  05/15/21 141 lb (64 kg)      Physical Exam Constitutional:      Appearance: Normal appearance.  HENT:     Head: Normocephalic and atraumatic.     Right Ear: Tympanic membrane, ear canal and external ear normal.     Left Ear: Tympanic membrane, ear canal and external ear normal.     Nose: Nose normal.     Mouth/Throat:     Mouth: Mucous membranes are moist.     Pharynx: No oropharyngeal exudate or posterior oropharyngeal erythema.  Eyes:     General: No scleral icterus.    Extraocular Movements: Extraocular movements intact.     Conjunctiva/sclera: Conjunctivae normal.     Pupils: Pupils are equal, round, and reactive to light.  Neck:     Thyroid: No thyromegaly.  Cardiovascular:     Rate and Rhythm: Normal rate and regular rhythm.     Pulses: Normal pulses.     Heart sounds: Normal heart sounds. No murmur heard.    No friction rub.  Pulmonary:     Effort: Pulmonary effort is normal.  Breath sounds: Normal breath sounds. No wheezing, rhonchi or rales.  Abdominal:     General: Bowel sounds are normal.     Palpations: Abdomen is soft.     Tenderness: There is no abdominal tenderness.  Musculoskeletal:        General: No deformity. Normal range of motion.       Legs:     Comments: Mild edema of anterior left lower leg with mild soreness when palpated.  Slight shift underneath fingers when pressed.  Lymphadenopathy:     Cervical: No cervical adenopathy.  Skin:    General: Skin is warm and dry.     Findings: No lesion.  Neurological:     General: No focal deficit present.     Mental Status: She is alert and oriented to person, place, and time.  Psychiatric:        Mood and Affect: Mood normal.        Thought Content: Thought content normal.      No results found for any visits on 06/10/23.    Assessment & Plan:  Well adult exam -     Comprehensive metabolic panel -     CBC with  Differential/Platelet -     TSH -     T4, free -     Hemoglobin A1c -     Lipid panel  Routine screening for STI (sexually transmitted infection) -     HIV Antibody (routine testing w rflx) -     RPR -     C. trachomatis/N. gonorrhoeae RNA  Ingrown left big toenail  Leukopenia, unspecified type -     CBC with Differential/Platelet  Age-appropriate health screenings discussed.  Will obtain labs.  Immunizations reviewed.  Next CPE in 1 year.  Discussed supportive care for hallux left great toenail.  For continued or worsening symptoms consider toenail removal.  Area of mild tenderness on left anterior lower leg/shin possibly a lipoma as movements underneath fingertips when palpated.  Continue to monitor.  Consider imaging such as ultrasound to further evaluate if needed.  Return if symptoms worsen or fail to improve.   Deeann Saint, MD

## 2023-06-11 LAB — RPR: RPR Ser Ql: NONREACTIVE

## 2023-06-11 LAB — CBC WITH DIFFERENTIAL/PLATELET
Basophils Absolute: 0 10*3/uL (ref 0.0–0.1)
Basophils Relative: 0.3 % (ref 0.0–3.0)
Eosinophils Absolute: 0 10*3/uL (ref 0.0–0.7)
Eosinophils Relative: 0.9 % (ref 0.0–5.0)
HCT: 40.5 % (ref 36.0–46.0)
Hemoglobin: 13.3 g/dL (ref 12.0–15.0)
Lymphocytes Relative: 52.2 % — ABNORMAL HIGH (ref 12.0–46.0)
Lymphs Abs: 1.8 10*3/uL (ref 0.7–4.0)
MCHC: 32.8 g/dL (ref 30.0–36.0)
MCV: 92.9 fl (ref 78.0–100.0)
Monocytes Absolute: 0.3 10*3/uL (ref 0.1–1.0)
Monocytes Relative: 8 % (ref 3.0–12.0)
Neutro Abs: 1.3 10*3/uL — ABNORMAL LOW (ref 1.4–7.7)
Neutrophils Relative %: 38.6 % — ABNORMAL LOW (ref 43.0–77.0)
Platelets: 284 10*3/uL (ref 150.0–400.0)
RBC: 4.36 Mil/uL (ref 3.87–5.11)
RDW: 13.3 % (ref 11.5–15.5)
WBC: 3.4 10*3/uL — ABNORMAL LOW (ref 4.0–10.5)

## 2023-06-11 LAB — LIPID PANEL
Cholesterol: 181 mg/dL (ref 0–200)
HDL: 83.4 mg/dL (ref >39.00)
LDL Cholesterol: 88 mg/dL (ref 0–99)
NonHDL: 97.19
Total CHOL/HDL Ratio: 2
Triglycerides: 45 mg/dL (ref 0.0–149.0)
VLDL: 9 mg/dL (ref 0.0–40.0)

## 2023-06-11 LAB — C. TRACHOMATIS/N. GONORRHOEAE RNA
C. trachomatis RNA, TMA: NOT DETECTED
N. gonorrhoeae RNA, TMA: NOT DETECTED

## 2023-06-11 LAB — HEMOGLOBIN A1C: Hgb A1c MFr Bld: 5.5 % (ref 4.6–6.5)

## 2023-06-11 LAB — COMPREHENSIVE METABOLIC PANEL
ALT: 12 U/L (ref 0–35)
AST: 17 U/L (ref 0–37)
Albumin: 4.1 g/dL (ref 3.5–5.2)
Alkaline Phosphatase: 64 U/L (ref 39–117)
BUN: 4 mg/dL — ABNORMAL LOW (ref 6–23)
CO2: 24 meq/L (ref 19–32)
Calcium: 9.1 mg/dL (ref 8.4–10.5)
Chloride: 104 meq/L (ref 96–112)
Creatinine, Ser: 0.85 mg/dL (ref 0.40–1.20)
GFR: 91.23 mL/min (ref >60.00)
Glucose, Bld: 66 mg/dL — ABNORMAL LOW (ref 70–99)
Potassium: 4.2 meq/L (ref 3.5–5.1)
Sodium: 136 meq/L (ref 135–145)
Total Bilirubin: 0.4 mg/dL (ref 0.2–1.2)
Total Protein: 7.4 g/dL (ref 6.0–8.3)

## 2023-06-11 LAB — HIV ANTIBODY (ROUTINE TESTING W REFLEX): HIV 1&2 Ab, 4th Generation: NONREACTIVE

## 2023-06-11 LAB — T4, FREE: Free T4: 1.01 ng/dL (ref 0.60–1.60)

## 2023-06-11 LAB — TSH: TSH: 1.94 u[IU]/mL (ref 0.35–5.50)

## 2023-11-08 ENCOUNTER — Encounter: Payer: Self-pay | Admitting: Family Medicine

## 2023-11-08 ENCOUNTER — Ambulatory Visit: Payer: Commercial Managed Care - PPO | Admitting: Family Medicine

## 2023-11-08 VITALS — BP 110/68 | HR 74 | Temp 98.7°F | Ht 68.0 in | Wt 156.6 lb

## 2023-11-08 DIAGNOSIS — K5900 Constipation, unspecified: Secondary | ICD-10-CM

## 2023-11-08 DIAGNOSIS — R350 Frequency of micturition: Secondary | ICD-10-CM | POA: Diagnosis not present

## 2023-11-08 DIAGNOSIS — R1084 Generalized abdominal pain: Secondary | ICD-10-CM | POA: Diagnosis not present

## 2023-11-08 LAB — CBC WITH DIFFERENTIAL/PLATELET
Basophils Absolute: 0 10*3/uL (ref 0.0–0.1)
Basophils Relative: 0.8 % (ref 0.0–3.0)
Eosinophils Absolute: 0 10*3/uL (ref 0.0–0.7)
Eosinophils Relative: 0.8 % (ref 0.0–5.0)
HCT: 40.7 % (ref 36.0–46.0)
Hemoglobin: 13.6 g/dL (ref 12.0–15.0)
Lymphocytes Relative: 46.2 % — ABNORMAL HIGH (ref 12.0–46.0)
Lymphs Abs: 2.1 10*3/uL (ref 0.7–4.0)
MCHC: 33.3 g/dL (ref 30.0–36.0)
MCV: 91.1 fL (ref 78.0–100.0)
Monocytes Absolute: 0.4 10*3/uL (ref 0.1–1.0)
Monocytes Relative: 8.7 % (ref 3.0–12.0)
Neutro Abs: 2 10*3/uL (ref 1.4–7.7)
Neutrophils Relative %: 43.5 % (ref 43.0–77.0)
Platelets: 240 10*3/uL (ref 150.0–400.0)
RBC: 4.47 Mil/uL (ref 3.87–5.11)
RDW: 13.1 % (ref 11.5–15.5)
WBC: 4.5 10*3/uL (ref 4.0–10.5)

## 2023-11-08 LAB — POCT URINALYSIS DIPSTICK
Bilirubin, UA: NEGATIVE
Blood, UA: NEGATIVE
Glucose, UA: NEGATIVE
Leukocytes, UA: NEGATIVE
Nitrite, UA: NEGATIVE
Protein, UA: POSITIVE — AB
Spec Grav, UA: 1.025 (ref 1.010–1.025)
Urobilinogen, UA: 1 U/dL
pH, UA: 6 (ref 5.0–8.0)

## 2023-11-08 LAB — COMPREHENSIVE METABOLIC PANEL
ALT: 9 U/L (ref 0–35)
AST: 15 U/L (ref 0–37)
Albumin: 4.4 g/dL (ref 3.5–5.2)
Alkaline Phosphatase: 71 U/L (ref 39–117)
BUN: 11 mg/dL (ref 6–23)
CO2: 24 meq/L (ref 19–32)
Calcium: 9.2 mg/dL (ref 8.4–10.5)
Chloride: 103 meq/L (ref 96–112)
Creatinine, Ser: 0.88 mg/dL (ref 0.40–1.20)
GFR: 87.26 mL/min (ref >60.00)
Glucose, Bld: 93 mg/dL (ref 70–99)
Potassium: 3.8 meq/L (ref 3.5–5.1)
Sodium: 134 meq/L — ABNORMAL LOW (ref 135–145)
Total Bilirubin: 0.4 mg/dL (ref 0.2–1.2)
Total Protein: 8 g/dL (ref 6.0–8.3)

## 2023-11-08 LAB — T4, FREE: Free T4: 0.82 ng/dL (ref 0.60–1.60)

## 2023-11-08 LAB — HEMOGLOBIN A1C: Hgb A1c MFr Bld: 5.7 % (ref 4.6–6.5)

## 2023-11-08 LAB — TSH: TSH: 1.25 u[IU]/mL (ref 0.35–5.50)

## 2023-11-08 NOTE — Progress Notes (Signed)
Established Patient Office Visit   Subjective  Patient ID: Lindsey Duran, female    DOB: May 14, 1991  Age: 33 y.o. MRN: [id number]  Chief Complaint  Patient presents with   Abdominal Pain    Started a week ago, right side stomach lower back and pelvis pain, sharp stabbing, rate of pain 4 out of 10      Patient is a 33 year old female seen for acute concern.  Patient endorses right upper quadrant abdominal pain, right flank pain, and back pain x 1 week.  Patient states symptoms are worse with walking.  Pain is 4/10 sharp, stabbing.  No association with food.  Patient endorses constipation, having to strain despite having daily BMs.  Patient recently recently noticed ruptured blood vessel in right eye due to straining. In the past patient would have a BM shortly after eating or drinking.  Patient juicing using celery and apples in the morning.  Typically does not eat until dinner.  Drinking 7-8 bottles of water per day.  Patient also notes urinary frequency, decreased urine output.    Patient Active Problem List   Diagnosis Date Noted   Post concussion syndrome 12/17/2019   History of asthma 12/17/2019   Past Medical History:  Diagnosis Date   Anemia    Asthma    Migraines    Past Surgical History:  Procedure Laterality Date   TONSILECTOMY, ADENOIDECTOMY, BILATERAL MYRINGOTOMY AND TUBES     Social History   Tobacco Use   Smoking status: Former    Current packs/day: 0.25    Average packs/day: 0.3 packs/day for 5.0 years (1.3 ttl pk-yrs)    Types: Cigarettes   Smokeless tobacco: Never  Vaping Use   Vaping status: Never Used  Substance Use Topics   Alcohol use: Not Currently   Drug use: Yes    Frequency: 1.0 times per week    Types: Marijuana   Family History  Problem Relation Age of Onset   Hypertension Paternal Grandmother    Diabetes gravidarum Other    High blood pressure Other    No Known Allergies    ROS Negative unless stated above    Objective:      BP 110/68 (BP Location: Right Arm, Patient Position: Sitting, Cuff Size: Normal)   Pulse 74   Temp 98.7 F (37.1 C) (Oral)   Ht 5\' 8"  (1.727 m)   Wt 156 lb 9.6 oz (71 kg)   LMP 10/20/2023   SpO2 97%   BMI 23.81 kg/m  BP Readings from Last 3 Encounters:  11/08/23 110/68  06/10/23 110/78  02/15/22 112/74   Wt Readings from Last 3 Encounters:  11/08/23 156 lb 9.6 oz (71 kg)  06/10/23 153 lb 12.8 oz (69.8 kg)  10/04/21 147 lb 12.8 oz (67 kg)      Physical Exam Constitutional:      Appearance: She is well-developed.  HENT:     Head: Normocephalic and atraumatic.     Mouth/Throat:     Mouth: Mucous membranes are moist.  Eyes:     Pupils: Pupils are equal, round, and reactive to light.  Cardiovascular:     Rate and Rhythm: Normal rate.  Pulmonary:     Effort: Pulmonary effort is normal.     Breath sounds: Normal breath sounds.  Abdominal:     General: Abdomen is flat. Bowel sounds are decreased.     Palpations: Abdomen is soft.     Tenderness: There is abdominal tenderness in the right upper quadrant,  right lower quadrant, epigastric area, suprapubic area and left lower quadrant. There is no right CVA tenderness, left CVA tenderness, guarding or rebound.  Skin:    General: Skin is warm and dry.  Neurological:     Mental Status: She is alert.    Results for orders placed or performed in visit on 11/08/23  POCT urinalysis dipstick  Result Value Ref Range   Color, UA yellow    Clarity, UA clear    Glucose, UA Negative Negative   Bilirubin, UA neg    Ketones, UA 5+    Spec Grav, UA 1.025 1.010 - 1.025   Blood, UA neg    pH, UA 6.0 5.0 - 8.0   Protein, UA Positive (A) Negative   Urobilinogen, UA 1.0 0.2 or 1.0 E.U./dL   Nitrite, UA neg    Leukocytes, UA Negative Negative   Appearance     Odor        Assessment & Plan:  Generalized abdominal pain -     TSH; Future -     T4, free; Future -     CBC with Differential/Platelet; Future -     Comprehensive  metabolic panel; Future  Urinary frequency -     POCT urinalysis dipstick -     Hemoglobin A1c; Future  Constipation, unspecified constipation type -     TSH; Future -     T4, free; Future -     CBC with Differential/Platelet; Future -     Comprehensive metabolic panel; Future  Patient with acute onset of generalized abdominal discomfort.  Discussed possible causes including constipation, gallbladder dysfunction/stones, peptic ulcer, renal stones, appendicitis, fibroids, etc..  Obtain POC UA and labs to further evaluate.  POC UA with ketones, likely 2/2 current diet.  Patient to start MiraLAX daily.  Further recommendations such as imaging based on lab results.  Given strict precautions for continued or worsening symptoms.  Return if symptoms worsen or fail to improve.   Deeann Saint, MD

## 2023-11-08 NOTE — Patient Instructions (Signed)
Urine sample appears negative for UTI.  Orders for labs were placed.  You can have those drawn while you are here in clinic today.  You can take MiraLAX daily to help with constipation.  MiraLAX can be found over-the-counter at your local drugstore, [employer], [employer], or [employer].  It also comes in a generic version.  If if needed we can place additional studies such as imaging based on the lab results.

## 2023-11-11 ENCOUNTER — Encounter: Payer: Self-pay | Admitting: Family Medicine

## 2023-11-13 ENCOUNTER — Ambulatory Visit: Payer: BC Managed Care – PPO | Admitting: Family Medicine

## 2023-11-17 ENCOUNTER — Emergency Department (HOSPITAL_BASED_OUTPATIENT_CLINIC_OR_DEPARTMENT_OTHER): Payer: Commercial Managed Care - PPO

## 2023-11-17 ENCOUNTER — Other Ambulatory Visit: Payer: Self-pay

## 2023-11-17 ENCOUNTER — Encounter (HOSPITAL_BASED_OUTPATIENT_CLINIC_OR_DEPARTMENT_OTHER): Payer: Self-pay | Admitting: Emergency Medicine

## 2023-11-17 DIAGNOSIS — R1031 Right lower quadrant pain: Secondary | ICD-10-CM | POA: Insufficient documentation

## 2023-11-17 DIAGNOSIS — R1011 Right upper quadrant pain: Secondary | ICD-10-CM | POA: Diagnosis present

## 2023-11-17 DIAGNOSIS — M79651 Pain in right thigh: Secondary | ICD-10-CM | POA: Diagnosis not present

## 2023-11-17 LAB — CBC WITH DIFFERENTIAL/PLATELET
Abs Immature Granulocytes: 0 10*3/uL (ref 0.00–0.07)
Basophils Absolute: 0 10*3/uL (ref 0.0–0.1)
Basophils Relative: 1 %
Eosinophils Absolute: 0.1 10*3/uL (ref 0.0–0.5)
Eosinophils Relative: 2 %
HCT: 34.9 % — ABNORMAL LOW (ref 36.0–46.0)
Hemoglobin: 11.8 g/dL — ABNORMAL LOW (ref 12.0–15.0)
Immature Granulocytes: 0 %
Lymphocytes Relative: 51 %
Lymphs Abs: 2.4 10*3/uL (ref 0.7–4.0)
MCH: 30 pg (ref 26.0–34.0)
MCHC: 33.8 g/dL (ref 30.0–36.0)
MCV: 88.8 fL (ref 80.0–100.0)
Monocytes Absolute: 0.4 10*3/uL (ref 0.1–1.0)
Monocytes Relative: 9 %
Neutro Abs: 1.7 10*3/uL (ref 1.7–7.7)
Neutrophils Relative %: 37 %
Platelets: 235 10*3/uL (ref 150–400)
RBC: 3.93 MIL/uL (ref 3.87–5.11)
RDW: 12.3 % (ref 11.5–15.5)
WBC: 4.6 10*3/uL (ref 4.0–10.5)
nRBC: 0 % (ref 0.0–0.2)

## 2023-11-17 LAB — URINALYSIS, ROUTINE W REFLEX MICROSCOPIC
Bilirubin Urine: NEGATIVE
Glucose, UA: NEGATIVE mg/dL
Ketones, ur: NEGATIVE mg/dL
Leukocytes,Ua: NEGATIVE
Nitrite: NEGATIVE
Protein, ur: NEGATIVE mg/dL
Specific Gravity, Urine: 1.02 (ref 1.005–1.030)
pH: 8.5 — ABNORMAL HIGH (ref 5.0–8.0)

## 2023-11-17 LAB — COMPREHENSIVE METABOLIC PANEL
ALT: 10 U/L (ref 0–44)
AST: 16 U/L (ref 15–41)
Albumin: 3.7 g/dL (ref 3.5–5.0)
Alkaline Phosphatase: 62 U/L (ref 38–126)
Anion gap: 6 (ref 5–15)
BUN: 9 mg/dL (ref 6–20)
CO2: 24 mmol/L (ref 22–32)
Calcium: 9.1 mg/dL (ref 8.9–10.3)
Chloride: 108 mmol/L (ref 98–111)
Creatinine, Ser: 0.74 mg/dL (ref 0.44–1.00)
GFR, Estimated: >60 mL/min (ref >60)
Glucose, Bld: 104 mg/dL — ABNORMAL HIGH (ref 70–99)
Potassium: 3.7 mmol/L (ref 3.5–5.1)
Sodium: 138 mmol/L (ref 135–145)
Total Bilirubin: 0.6 mg/dL (ref 0.0–1.2)
Total Protein: 6.9 g/dL (ref 6.5–8.1)

## 2023-11-17 LAB — LIPASE, BLOOD: Lipase: 54 U/L — ABNORMAL HIGH (ref 11–51)

## 2023-11-17 LAB — URINALYSIS, MICROSCOPIC (REFLEX)

## 2023-11-17 LAB — PREGNANCY, URINE: Preg Test, Ur: NEGATIVE

## 2023-11-17 NOTE — ED Triage Notes (Signed)
Pt c/o pain to RT side (upper, lower abd and flank) x 2 wks; sts PCP treated her for constipation, now having regular BMs, but pain persists; +nausea

## 2023-11-18 ENCOUNTER — Emergency Department (HOSPITAL_BASED_OUTPATIENT_CLINIC_OR_DEPARTMENT_OTHER)
Admission: EM | Admit: 2023-11-18 | Discharge: 2023-11-18 | Disposition: A | Discharge status: Home / Self Care | Payer: Commercial Managed Care - PPO | Attending: Emergency Medicine | Admitting: Emergency Medicine

## 2023-11-18 ENCOUNTER — Ambulatory Visit: Payer: Self-pay | Admitting: Family Medicine

## 2023-11-18 DIAGNOSIS — R109 Unspecified abdominal pain: Secondary | ICD-10-CM

## 2023-11-18 MED ORDER — MELOXICAM 7.5 MG PO TABS
7.5000 mg | ORAL_TABLET | Freq: Every day | ORAL | 0 refills | Status: DC
Start: 2023-11-18 — End: 2024-02-06

## 2023-11-18 MED ORDER — LIDOCAINE 5 % EX PTCH
3.0000 | MEDICATED_PATCH | CUTANEOUS | Status: DC
  Administered 2023-11-18: 3 via TRANSDERMAL
  Filled 2023-11-18: qty 3

## 2023-11-18 MED ORDER — KETOROLAC TROMETHAMINE 60 MG/2ML IM SOLN
60.0000 mg | Freq: Once | INTRAMUSCULAR | Status: AC
  Administered 2023-11-18: 60 mg via INTRAMUSCULAR
  Filled 2023-11-18: qty 2

## 2023-11-18 NOTE — Telephone Encounter (Signed)
Copied from CRM [id number]. Topic: Clinical - Medication Question >> Nov 18, 2023 11:29 AM Irine Seal wrote: Reason for CRM: Patient is wanting medication for urinary tract infection symptoms called in prior to her hospital follow up on 11/20/23  read the patient the notes that stated the pelvic exam could be performed but not the ultrasound, patient verbalized understanding. callback [phone]    Chief Complaint: Advice Only  Additional Notes: Patient requesting medication for UTI, advised patient that she does not have UTI per ED notes today. Pt verbalized understanding.    Reason for Disposition  General information question, no triage required and triager able to answer question  Answer Assessment - Initial Assessment Questions 1. REASON FOR CALL or QUESTION: "What is your reason for calling today?" or "How can I best help you?" or "What question do you have that I can help answer?"     Patient asking for medication for UTI  Protocols used: Information Only Call - No Triage-A-AH

## 2023-11-18 NOTE — Telephone Encounter (Signed)
Called patient and left a message to return call. Dr. Salomon Fick is unable to give medication for a UTI prior to seeing the patient and will see the patient on 11/20/23

## 2023-11-18 NOTE — ED Provider Notes (Signed)
[facility] Provider Note   CSN: [id number] Arrival date & time: 11/17/23  2107     History  Chief Complaint  Patient presents with   Abdominal Pain   Back Pain    Lindsey Duran is a 33 y.o. female.   Abdominal Pain Pain location:  RUQ, RLQ and R flank Pain radiation: right leg. Duration:  3 weeks Timing:  Constant Progression:  Unchanged Context: not eating and not trauma   Relieved by:  Nothing Worsened by:  Nothing Ineffective treatments: laxatives. Associated symptoms: no diarrhea, no dysuria, no fever, no nausea, no shortness of breath and no vomiting   Risk factors: no NSAID use   Patient with 2.5 weeks of right abdomen pain and pain in right thigh with ambulation.  PMD told patient it was constipation and patient took enema yesterday and had passage of large stool but still has pain.  No f/c/r.  No nausea or vomiting      Home Medications Prior to Admission medications   Medication Sig Start Date End Date Taking? Authorizing Provider  albuterol (VENTOLIN HFA) 108 (90 Base) MCG/ACT inhaler Inhale 2 puffs into the lungs every 6 (six) hours as needed for wheezing or shortness of breath (Cough). Patient not taking: Reported on 11/08/2023 02/15/22   Theadora Rama Scales, PA-C  fexofenadine (ALLEGRA) 180 MG tablet Take 1 tablet (180 mg total) by mouth daily. 02/15/22 08/14/22  Theadora Rama Scales, PA-C  fluticasone (FLONASE) 50 MCG/ACT nasal spray Place 1 spray into both nostrils daily. Begin by using 2 sprays in each nare daily for 3 to 5 days, then decrease to 1 spray in each nare daily. Patient not taking: Reported on 11/08/2023 02/15/22   Theadora Rama Scales, PA-C  montelukast (SINGULAIR) 10 MG tablet Take 1 tablet (10 mg total) by mouth at bedtime. 02/15/22 06/10/23  Theadora Rama Scales, PA-C      Allergies    Patient has no known allergies.    Review of Systems   Review of Systems  Constitutional:   Negative for fever.  Respiratory:  Negative for shortness of breath, wheezing and stridor.   Gastrointestinal:  Positive for abdominal pain. Negative for diarrhea, nausea and vomiting.  Genitourinary:  Negative for dysuria.  All other systems reviewed and are negative.   Physical Exam Updated Vital Signs BP 118/63 (BP Location: Right Arm)   Pulse (!) 58   Temp 98.2 F (36.8 C)   Resp 17   Ht 5\' 8"  (1.727 m)   Wt 71.2 kg   LMP 11/15/2023   SpO2 100%   BMI 23.87 kg/m  Physical Exam Vitals and nursing note reviewed.  Constitutional:      General: She is not in acute distress.    Appearance: She is well-developed.  HENT:     Head: Normocephalic and atraumatic.     Nose: Nose normal.  Eyes:     Pupils: Pupils are equal, round, and reactive to light.  Cardiovascular:     Rate and Rhythm: Normal rate and regular rhythm.     Pulses: Normal pulses.     Heart sounds: Normal heart sounds.  Pulmonary:     Effort: Pulmonary effort is normal. No respiratory distress.     Breath sounds: Normal breath sounds.  Abdominal:     General: Bowel sounds are normal. There is no distension.     Palpations: Abdomen is soft. There is no mass.     Tenderness: There is no  abdominal tenderness. There is no guarding or rebound. Negative signs include Murphy's sign and McBurney's sign.     Hernia: No hernia is present.  Musculoskeletal:        General: Normal range of motion.     Cervical back: Neck supple.  Skin:    General: Skin is dry.     Capillary Refill: Capillary refill takes less than 2 seconds.     Findings: No erythema or rash.  Neurological:     General: No focal deficit present.     Mental Status: She is oriented to person, place, and time.     Deep Tendon Reflexes: Reflexes normal.  Psychiatric:        Mood and Affect: Mood normal.     ED Results / Procedures / Treatments   Labs (all labs ordered are listed, but only abnormal results are displayed) Results for orders placed  or performed during the hospital encounter of 11/18/23  Lipase, blood   Collection Time: 11/17/23  9:51 PM  Result Value Ref Range   Lipase 54 (H) 11 - 51 U/L  Comprehensive metabolic panel   Collection Time: 11/17/23  9:51 PM  Result Value Ref Range   Sodium 138 135 - 145 mmol/L   Potassium 3.7 3.5 - 5.1 mmol/L   Chloride 108 98 - 111 mmol/L   CO2 24 22 - 32 mmol/L   Glucose, Bld 104 (H) 70 - 99 mg/dL   BUN 9 6 - 20 mg/dL   Creatinine, Ser 1.61 0.44 - 1.00 mg/dL   Calcium 9.1 8.9 - 09.6 mg/dL   Total Protein 6.9 6.5 - 8.1 g/dL   Albumin 3.7 3.5 - 5.0 g/dL   AST 16 15 - 41 U/L   ALT 10 0 - 44 U/L   Alkaline Phosphatase 62 38 - 126 U/L   Total Bilirubin 0.6 0.0 - 1.2 mg/dL   GFR, Estimated >04 >54 mL/min   Anion gap 6 5 - 15  CBC with Differential   Collection Time: 11/17/23  9:51 PM  Result Value Ref Range   WBC 4.6 4.0 - 10.5 K/uL   RBC 3.93 3.87 - 5.11 MIL/uL   Hemoglobin 11.8 (L) 12.0 - 15.0 g/dL   HCT 09.8 (L) 11.9 - 14.7 %   MCV 88.8 80.0 - 100.0 fL   MCH 30.0 26.0 - 34.0 pg   MCHC 33.8 30.0 - 36.0 g/dL   RDW 82.9 56.2 - 13.0 %   Platelets 235 150 - 400 K/uL   nRBC 0.0 0.0 - 0.2 %   Neutrophils Relative % 37 %   Neutro Abs 1.7 1.7 - 7.7 K/uL   Lymphocytes Relative 51 %   Lymphs Abs 2.4 0.7 - 4.0 K/uL   Monocytes Relative 9 %   Monocytes Absolute 0.4 0.1 - 1.0 K/uL   Eosinophils Relative 2 %   Eosinophils Absolute 0.1 0.0 - 0.5 K/uL   Basophils Relative 1 %   Basophils Absolute 0.0 0.0 - 0.1 K/uL   Immature Granulocytes 0 %   Abs Immature Granulocytes 0.00 0.00 - 0.07 K/uL  Urinalysis, Routine w reflex microscopic -Urine, Clean Catch   Collection Time: 11/17/23  9:53 PM  Result Value Ref Range   Color, Urine YELLOW YELLOW   APPearance CLEAR CLEAR   Specific Gravity, Urine 1.020 1.005 - 1.030   pH 8.5 (H) 5.0 - 8.0   Glucose, UA NEGATIVE NEGATIVE mg/dL   Hgb urine dipstick MODERATE (A) NEGATIVE   Bilirubin Urine NEGATIVE NEGATIVE  Ketones, ur NEGATIVE  NEGATIVE mg/dL   Protein, ur NEGATIVE NEGATIVE mg/dL   Nitrite NEGATIVE NEGATIVE   Leukocytes,Ua NEGATIVE NEGATIVE  Pregnancy, urine   Collection Time: 11/17/23  9:53 PM  Result Value Ref Range   Preg Test, Ur NEGATIVE NEGATIVE  Urinalysis, Microscopic (reflex)   Collection Time: 11/17/23  9:53 PM  Result Value Ref Range   RBC / HPF 0-5 0 - 5 RBC/hpf   WBC, UA 0-5 0 - 5 WBC/hpf   Bacteria, UA RARE (A) NONE SEEN   Squamous Epithelial / HPF 0-5 0 - 5 /HPF   CT Renal Stone Study Result Date: 11/17/2023 CLINICAL DATA:  Right-sided abdominal pain EXAM: CT ABDOMEN AND PELVIS WITHOUT CONTRAST TECHNIQUE: Multidetector CT imaging of the abdomen and pelvis was performed following the standard protocol without IV contrast. RADIATION DOSE REDUCTION: This exam was performed according to the departmental dose-optimization program which includes automated exposure control, adjustment of the mA and/or kV according to patient size and/or use of iterative reconstruction technique. COMPARISON:  CT abdomen and pelvis 11/25/2014 FINDINGS: Lower chest: No acute abnormality. Hepatobiliary: No focal liver abnormality is seen. No gallstones, gallbladder wall thickening, or biliary dilatation. Pancreas: Unremarkable. No pancreatic ductal dilatation or surrounding inflammatory changes. Spleen: Normal in size without focal abnormality. Adrenals/Urinary Tract: Adrenal glands are unremarkable. Kidneys are normal, without renal calculi, focal lesion, or hydronephrosis. Bladder is unremarkable. Stomach/Bowel: Stomach is within normal limits. Appendix appears normal. No evidence of bowel wall thickening, distention, or inflammatory changes. Vascular/Lymphatic: No significant vascular findings are present. No enlarged abdominal or pelvic lymph nodes. Reproductive: Uterus and bilateral adnexa are unremarkable. Other: No abdominal wall hernia or abnormality. No abdominopelvic ascites. Musculoskeletal: No acute or significant osseous  findings. IMPRESSION: No acute localizing process in the abdomen or pelvis. Electronically Signed   By: Darliss Cheney M.D.   On: 11/17/2023 23:44    EKG None  Radiology CT Renal Stone Study Result Date: 11/17/2023 CLINICAL DATA:  Right-sided abdominal pain EXAM: CT ABDOMEN AND PELVIS WITHOUT CONTRAST TECHNIQUE: Multidetector CT imaging of the abdomen and pelvis was performed following the standard protocol without IV contrast. RADIATION DOSE REDUCTION: This exam was performed according to the departmental dose-optimization program which includes automated exposure control, adjustment of the mA and/or kV according to patient size and/or use of iterative reconstruction technique. COMPARISON:  CT abdomen and pelvis 11/25/2014 FINDINGS: Lower chest: No acute abnormality. Hepatobiliary: No focal liver abnormality is seen. No gallstones, gallbladder wall thickening, or biliary dilatation. Pancreas: Unremarkable. No pancreatic ductal dilatation or surrounding inflammatory changes. Spleen: Normal in size without focal abnormality. Adrenals/Urinary Tract: Adrenal glands are unremarkable. Kidneys are normal, without renal calculi, focal lesion, or hydronephrosis. Bladder is unremarkable. Stomach/Bowel: Stomach is within normal limits. Appendix appears normal. No evidence of bowel wall thickening, distention, or inflammatory changes. Vascular/Lymphatic: No significant vascular findings are present. No enlarged abdominal or pelvic lymph nodes. Reproductive: Uterus and bilateral adnexa are unremarkable. Other: No abdominal wall hernia or abnormality. No abdominopelvic ascites. Musculoskeletal: No acute or significant osseous findings. IMPRESSION: No acute localizing process in the abdomen or pelvis. Electronically Signed   By: Darliss Cheney M.D.   On: 11/17/2023 23:44    Procedures Procedures    Medications Ordered in ED Medications  lidocaine (LIDODERM) 5 % 3 patch (3 patches Transdermal Patch Applied 11/18/23  0115)  ketorolac (TORADOL) injection 60 mg (60 mg Intramuscular Given 11/18/23 0117)    ED Course/ Medical Decision Making/ A&P  Medical Decision Making Patient with pain x 2.5 weeks right side and into right thigh with ambulation.  Large BM without change   Amount and/or Complexity of Data Reviewed External Data Reviewed: notes.    Details: Previous notes reviewed  Labs: ordered.    Details: Normal sodium 138, normal potassium 3.7, normal creatinine 0.74, normal LFTs.  Normal white count 4.6, hemoglobin slight low 11.8, normal platelets.  Urine is without UTI  Radiology: ordered and independent interpretation performed.    Details: No stones or obstruction by me   Risk Prescription drug management. Risk Details: Very well appearing.  Norma vital signs and exam.  Normal labs.  Normal imaging.  I suspect this is muscle related as it travels to the leg with ambulation.  No signs of sepsis or surgical abdomen. Alternate tylenol and NSAIDs.  Stable for discharge with close follow up.      Final Clinical Impression(s) / ED Diagnoses Final diagnoses:  None   I have reviewed the triage vital signs and the nursing notes. Pertinent labs & imaging results that were available during my care of the patient were reviewed by me and considered in my medical decision making (see chart for details). After history, exam, and medical workup I feel the patient has been appropriately medically screened and is safe for discharge home. Pertinent diagnoses were discussed with the patient. Patient was given return precautions.    Rx / DC Orders ED Discharge Orders     None         Tammie Yanda, MD 11/18/23 0145

## 2023-11-20 ENCOUNTER — Ambulatory Visit: Payer: Commercial Managed Care - PPO | Admitting: Family Medicine

## 2023-11-20 ENCOUNTER — Encounter: Payer: Self-pay | Admitting: Family Medicine

## 2023-11-20 VITALS — BP 98/68 | HR 59 | Temp 98.8°F | Ht 68.0 in | Wt 160.4 lb

## 2023-11-20 DIAGNOSIS — R1031 Right lower quadrant pain: Secondary | ICD-10-CM

## 2023-11-20 DIAGNOSIS — R1084 Generalized abdominal pain: Secondary | ICD-10-CM

## 2023-11-20 NOTE — Progress Notes (Unsigned)
Established Patient Office Visit   Subjective  Patient ID: Lindsey Duran, female    DOB: 02-12-1991  Age: 33 y.o. MRN: [id number]  Chief Complaint  Patient presents with  . Follow-up    Stomach back right side pain, patient was seen in the ED, rate of pain 7 out of 10     Pt is a 33 yo female seen for ED f/u.  Pt seen in ED 11/15/23 for increasing R sided abd, back, and pelvic pain.  CT stone study in ED was negative. Given mobic and advised to f/u.  Still having pain, throbbing, 7/10, not as severe as day of ED visit.  Pain with walking.  Prior to ED visit pt felt relief Friday night s/p enema.  Sat am pain returned.  Pt notes h/o ruptured ovarian cyst.  Menses started 11/13/23.       Patient Active Problem List   Diagnosis Date Noted  . Post concussion syndrome 12/17/2019  . History of asthma 12/17/2019   Past Medical History:  Diagnosis Date  . Anemia   . Asthma   . Migraines    Past Surgical History:  Procedure Laterality Date  . TONSILECTOMY, ADENOIDECTOMY, BILATERAL MYRINGOTOMY AND TUBES     Social History   Tobacco Use  . Smoking status: Former    Current packs/day: 0.25    Average packs/day: 0.3 packs/day for 5.0 years (1.3 ttl pk-yrs)    Types: Cigarettes  . Smokeless tobacco: Never  Vaping Use  . Vaping status: Never Used  Substance Use Topics  . Alcohol use: Not Currently  . Drug use: Yes    Frequency: 1.0 times per week    Types: Marijuana   Family History  Problem Relation Age of Onset  . Hypertension Paternal Grandmother   . Diabetes gravidarum Other   . High blood pressure Other    No Known Allergies    ROS Negative unless stated above    Objective:     BP 98/68 (BP Location: Right Arm, Patient Position: Sitting, Cuff Size: Large)   Pulse (!) 59   Temp 98.8 F (37.1 C) (Oral)   Ht 5\' 8"  (1.727 m)   Wt 160 lb 6.4 oz (72.8 kg)   LMP 11/15/2023   SpO2 97%   BMI 24.39 kg/m  BP Readings from Last 3 Encounters:  11/20/23 98/68   11/17/23 118/63  11/08/23 110/68   Wt Readings from Last 3 Encounters:  11/20/23 160 lb 6.4 oz (72.8 kg)  11/17/23 157 lb (71.2 kg)  11/08/23 156 lb 9.6 oz (71 kg)     Physical Exam Constitutional:      General: She is not in acute distress.    Appearance: Normal appearance.  HENT:     Head: Normocephalic and atraumatic.     Nose: Nose normal.     Mouth/Throat:     Mouth: Mucous membranes are moist.  Cardiovascular:     Rate and Rhythm: Normal rate and regular rhythm.     Heart sounds: Normal heart sounds. No murmur heard.    No gallop.  Pulmonary:     Effort: Pulmonary effort is normal. No respiratory distress.     Breath sounds: Normal breath sounds. No wheezing, rhonchi or rales.  Abdominal:     General: Bowel sounds are normal.     Palpations: Abdomen is soft.     Tenderness: There is abdominal tenderness in the right upper quadrant, right lower quadrant, epigastric area and suprapubic area. There is  no right CVA tenderness, left CVA tenderness, guarding or rebound.  Musculoskeletal:     Cervical back: Normal.     Thoracic back: Normal.     Lumbar back: Normal.  Skin:    General: Skin is warm and dry.  Neurological:     Mental Status: She is alert and oriented to person, place, and time.     No results found for any visits on 11/20/23.    Assessment & Plan:  Generalized abdominal pain -     Ambulatory referral to Gastroenterology -     Ambulatory referral to Obstetrics / Gynecology -     US PELVIC COMPLETE WITH TRANSVAGINAL; Future    No follow-ups on file.   Deeann Saint, MD

## 2023-11-21 ENCOUNTER — Telehealth: Payer: Self-pay

## 2023-11-21 NOTE — Telephone Encounter (Signed)
Called and spoke with [facility], Dr. Salomon Fick would like just an US of the ovaries and uterus

## 2023-11-21 NOTE — Telephone Encounter (Signed)
Copied from CRM [id number]. Topic: Clinical - Request for Lab/Test Order >> Nov 21, 2023  8:25 AM Almira Coaster wrote: Reason for CRM: [facility] is calling regarding ultrasound order they received, they are requesting two more additional exams based on the diagnosis. They need ultrasound of the abdomen to look at the right upper quadrant and an ultrasound pelvic limited. Patient is unable to be scheduled until additional orders are placed. If you need to speak with them directly their phone number is [phone] option 1 and then option 5.

## 2023-11-26 ENCOUNTER — Ambulatory Visit
Admission: RE | Admit: 2023-11-26 | Discharge: 2023-11-26 | Discharge status: Home / Self Care | Payer: Commercial Managed Care - PPO | Source: Ambulatory Visit | Attending: Family Medicine

## 2023-11-26 DIAGNOSIS — R1084 Generalized abdominal pain: Secondary | ICD-10-CM

## 2023-11-28 ENCOUNTER — Encounter: Payer: Self-pay | Admitting: Family Medicine

## 2024-02-05 NOTE — Progress Notes (Unsigned)
 Lindsey Amy, PA-C [address] [place], [place]  [address] Phone: [phone]   Gastroenterology Consultation  Referring Provider:     Deeann Saint, MD Primary Care Physician:  Deeann Saint, MD Primary Gastroenterologist:  Lindsey Amy, PA-C / Tiajuana Amass, MD  Reason for Consultation:     Generalized abdominal pain; chronic pelvic pain.        HPI:   Lindsey Duran is a 33 y.o. y/o female referred for consultation & management  by Deeann Saint, MD.    Patient was seen in the ED 11/15/2023 for increasing right side abdominal pain, back and pelvic pain.  Abdominal pelvic CT without contrast 11/17/23  showed no acute abnormality.  CBC showed slightly low hemoglobin 11.8, MCV 88.  Normal CMP.  Slightly elevated lipase 54.  UA and urine pregnancy test negative.  Was given meloxicam.  Previous labs showed baseline hemoglobin 13g.  She denies menorrhagia.  Is not taking iron or B12.  She followed up with her PCP Dr. Salomon Fick 11/20/2023.  She had persistent RLQ, right side and right groin pain with constipation.  Pelvic ultrasound 11/26/2023 showed 1.6 cm uterine fibroid.  No other abnormality.  Normal ovaries.  She tried Miralax to treat constipation with little benefit.  Then she used fleets enema which helped.  Current symptoms: Patient has persistent RLQ pain, right side pain, and right groin pain which is worse if she strains to have a bowel movement.  RLQ pain is relieved after having a BM.  She denies rectal bleeding or weight loss.  No family history of Colon Cancer or IBD.  She is eating high fiber diet with fruits / vegetables.  No current treatment for constipation.  She stopped Miralax which did not help.  She remembers having mild intermitent constipation as a child and teen.  Has noted having worsening constipation since 10/2023.  No previous abdominal or pelvic surgeries.  No previous GI evaluation.  Past Medical History:  Diagnosis Date   Anemia     Asthma    Fibroid, uterine    Migraines     Past Surgical History:  Procedure Laterality Date   TONSILECTOMY, ADENOIDECTOMY, BILATERAL MYRINGOTOMY AND TUBES      Prior to Admission medications   Medication Sig Start Date End Date Taking? Authorizing Provider  albuterol (VENTOLIN HFA) 108 (90 Base) MCG/ACT inhaler Inhale 2 puffs into the lungs every 6 (six) hours as needed for wheezing or shortness of breath (Cough). Patient not taking: Reported on 11/08/2023 02/15/22   Theadora Rama Scales, PA-C  fexofenadine (ALLEGRA) 180 MG tablet Take 1 tablet (180 mg total) by mouth daily. 02/15/22 08/14/22  Theadora Rama Scales, PA-C  fluticasone (FLONASE) 50 MCG/ACT nasal spray Place 1 spray into both nostrils daily. Begin by using 2 sprays in each nare daily for 3 to 5 days, then decrease to 1 spray in each nare daily. Patient not taking: Reported on 11/08/2023 02/15/22   Theadora Rama Scales, PA-C  meloxicam (MOBIC) 7.5 MG tablet Take 1 tablet (7.5 mg total) by mouth daily. 11/18/23   Palumbo, April, MD  montelukast (SINGULAIR) 10 MG tablet Take 1 tablet (10 mg total) by mouth at bedtime. 02/15/22 06/10/23  Theadora Rama Scales, PA-C    Family History  Problem Relation Age of Onset   Hypertension Paternal Grandmother    Diabetes gravidarum Other    High blood pressure Other    Colon cancer Neg Hx    Esophageal cancer  Neg Hx    Pancreatic cancer Neg Hx    Colon polyps Neg Hx      Social History   Tobacco Use   Smoking status: Former    Current packs/day: 0.25    Average packs/day: 0.3 packs/day for 5.0 years (1.3 ttl pk-yrs)    Types: Cigarettes   Smokeless tobacco: Never  Vaping Use   Vaping status: Every Day   Substances: Nicotine  Substance Use Topics   Alcohol use: Yes    Comment: occ/socially   Drug use: Not Currently    Frequency: 1.0 times per week    Types: Marijuana    Allergies as of 02/06/2024   (No Known Allergies)    Review of Systems:    All systems  reviewed and negative except where noted in HPI.   Physical Exam:  BP 100/70 (Cuff Size: Normal)   Pulse 81   Ht 5\' 8"  (1.727 m)   Wt 157 lb 12.8 oz (71.6 kg)   LMP 01/22/2024 (Exact Date)   BMI 23.99 kg/m  Patient's last menstrual period was 01/22/2024 (exact date).  General:   Alert,  Well-developed, well-nourished, pleasant and cooperative in NAD Lungs:  Respirations even and unlabored.  Clear throughout to auscultation.   No wheezes, crackles, or rhonchi. No acute distress. Heart:  Regular rate and rhythm; no murmurs, clicks, rubs, or gallops. Abdomen:  Normal bowel sounds.  No bruits.  Soft, and non-distended without masses, hepatosplenomegaly or hernias noted.  No Tenderness.  NO RLQ or any other abdominal tenderness.  No guarding or rebound tenderness.    Musculoskeletal: Mild Discomfort in the Right Groin with internal and external rotation of the right hip. Neurologic:  Alert and oriented x3;  grossly normal neurologically. Psych:  Alert and cooperative. Normal mood and affect.  Imaging Studies: No results found.  Assessment and Plan:   Lindsey Duran is a 33 y.o. y/o female has been referred for evaluation of RLQ Pain and Constipation.  Recent pelvic ultrasound showed one small 1.6cm uterine fibroid, otherwise normal. Abdominal pelvic CT without contrast was unrevealing / Normal.  Labs showed mild anemia with hemoglobin 11.8. (Baseline Hgb 13g).  I am scheduling a Colonoscopy to evaluate for Colon polyps / colon neoplasm.  We discussed treatment for constipation at length.  I believe she may have musculoskeletal component of right groin and hip pain.  1.  RLQ abdominal pain  Scheduling Colonoscopy I discussed risks of colonoscopy with patient to include risk of bleeding, colon perforation, and risk of sedation.  Patient expressed understanding and agrees to proceed with colonoscopy.   2.  Worsening Constipation / Change in Bowel Habits  For Constipation Try: Samples  of Linzess 72 mcg QD for 1 week,  then 145 mcg QD for 1 week,  Please let me know which dose works best, and then we can call in a prescription.    Continue High Fiber diet and drink 64 ounces water / fluids daily.  Scheduling Colonoscopy  3.  Mild normocytic anemia  Labs: CBC, iron panel, ferritin, B12, folate.  Not on iron or B12.  No menorrhagia.  Scheduling Colonoscopy  4.  Right Hip Pain  F/u with PCP for possible hip x-rays.  F/U with Orthopedic if Pain persists.  Follow up 4 weeks after Colonoscopy with TG.   Lindsey Amy, PA-C

## 2024-02-06 ENCOUNTER — Encounter: Payer: Self-pay | Admitting: Physician Assistant

## 2024-02-06 ENCOUNTER — Ambulatory Visit: Admitting: Physician Assistant

## 2024-02-06 ENCOUNTER — Other Ambulatory Visit (INDEPENDENT_AMBULATORY_CARE_PROVIDER_SITE_OTHER)

## 2024-02-06 VITALS — BP 100/70 | HR 81 | Ht 68.0 in | Wt 157.8 lb

## 2024-02-06 DIAGNOSIS — K5900 Constipation, unspecified: Secondary | ICD-10-CM

## 2024-02-06 DIAGNOSIS — R1031 Right lower quadrant pain: Secondary | ICD-10-CM | POA: Diagnosis not present

## 2024-02-06 DIAGNOSIS — D649 Anemia, unspecified: Secondary | ICD-10-CM

## 2024-02-06 DIAGNOSIS — D509 Iron deficiency anemia, unspecified: Secondary | ICD-10-CM

## 2024-02-06 DIAGNOSIS — R194 Change in bowel habit: Secondary | ICD-10-CM

## 2024-02-06 LAB — CBC WITH DIFFERENTIAL/PLATELET
Basophils Absolute: 0 10*3/uL (ref 0.0–0.1)
Basophils Relative: 0.2 % (ref 0.0–3.0)
Eosinophils Absolute: 0 10*3/uL (ref 0.0–0.7)
Eosinophils Relative: 1.3 % (ref 0.0–5.0)
HCT: 41 % (ref 36.0–46.0)
Hemoglobin: 13.5 g/dL (ref 12.0–15.0)
Lymphocytes Relative: 41 % (ref 12.0–46.0)
Lymphs Abs: 1.5 10*3/uL (ref 0.7–4.0)
MCHC: 33 g/dL (ref 30.0–36.0)
MCV: 91.6 fl (ref 78.0–100.0)
Monocytes Absolute: 0.4 10*3/uL (ref 0.1–1.0)
Monocytes Relative: 10.2 % (ref 3.0–12.0)
Neutro Abs: 1.7 10*3/uL (ref 1.4–7.7)
Neutrophils Relative %: 47.3 % (ref 43.0–77.0)
Platelets: 263 10*3/uL (ref 150.0–400.0)
RBC: 4.48 Mil/uL (ref 3.87–5.11)
RDW: 13.6 % (ref 11.5–15.5)
WBC: 3.6 10*3/uL — ABNORMAL LOW (ref 4.0–10.5)

## 2024-02-06 LAB — B12 AND FOLATE PANEL
Folate: 11.7 ng/mL (ref >5.9)
Vitamin B-12: 400 pg/mL (ref 211–911)

## 2024-02-06 MED ORDER — NA SULFATE-K SULFATE-MG SULF 17.5-3.13-1.6 GM/177ML PO SOLN
1.0000 | Freq: Once | ORAL | 0 refills | Status: AC
Start: 2024-02-06 — End: 2024-02-06

## 2024-02-06 NOTE — Patient Instructions (Addendum)
 Your provider has requested that you go to the basement level for lab work before leaving today. Press "B" on the elevator. The lab is located at the first door on the left as you exit the elevator.   We have sent the following medications to your pharmacy for you to pick up at your convenience: Suprep  You have been scheduled for a colonoscopy. Please follow written instructions given to you at your visit today.   If you use inhalers (even only as needed), please bring them with you on the day of your procedure.  DO NOT TAKE 7 DAYS PRIOR TO TEST- Trulicity (dulaglutide) Ozempic, Wegovy (semaglutide) Mounjaro (tirzepatide) Bydureon Bcise (exanatide extended release)  DO NOT TAKE 1 DAY PRIOR TO YOUR TEST Rybelsus (semaglutide) Adlyxin (lixisenatide) Victoza (liraglutide) Byetta (exanatide) ___________________________________________________________________________  For Constipation Try: Samples of Linzess 72 mcg 1 tablet once daily for 1 week,  then 145 mcg 1 tablet once daily for 1 week,  Please let me know which dose works best, and then we can call in a prescription.   We can increase Linzess to 290mcg 1 tablet once daily if needed.  _______________________________________________________  If your blood pressure at your visit was 140/90 or greater, please contact your primary care physician to follow up on this.  _______________________________________________________  If you are age 32 or older, your body mass index should be between 23-30. Your Body mass index is 23.99 kg/m. If this is out of the aforementioned range listed, please consider follow up with your Primary Care Provider.  If you are age 67 or younger, your body mass index should be between 19-25. Your Body mass index is 23.99 kg/m. If this is out of the aformentioned range listed, please consider follow up with your Primary Care Provider.   ________________________________________________________  [facility] providers would like to encourage you to use MYCHART to communicate with providers for non-urgent requests or questions.  Due to long hold times on the telephone, sending your provider a message by [facility] may be a faster and more efficient way to get a response.  Please allow 48 business hours for a response.  Please remember that this is for non-urgent requests.  _______________________________________________________

## 2024-02-07 LAB — IRON,TIBC AND FERRITIN PANEL
%SAT: 41 % (ref 16–45)
Ferritin: 10 ng/mL — ABNORMAL LOW (ref 16–154)
Iron: 139 ug/dL (ref 40–190)
TIBC: 342 ug/dL (ref 250–450)

## 2024-02-13 NOTE — Progress Notes (Signed)
 Agree with the assessment and plan as outlined by Brigitte Canard, PA-C.  Although IBS more likely, colonoscopy not unreasonable to exclude IBD given presence of pain and anemia.

## 2024-02-20 ENCOUNTER — Encounter: Admitting: Gastroenterology

## 2024-02-24 ENCOUNTER — Encounter: Payer: Self-pay | Admitting: Obstetrics and Gynecology

## 2024-02-24 ENCOUNTER — Ambulatory Visit: Admitting: Obstetrics and Gynecology

## 2024-02-24 ENCOUNTER — Other Ambulatory Visit: Payer: Self-pay

## 2024-02-24 ENCOUNTER — Other Ambulatory Visit (HOSPITAL_COMMUNITY)
Admission: RE | Admit: 2024-02-24 | Discharge: 2024-02-24 | Disposition: A | Discharge status: Home / Self Care | Source: Ambulatory Visit | Attending: Obstetrics and Gynecology | Admitting: Obstetrics and Gynecology

## 2024-02-24 VITALS — BP 112/71 | HR 64 | Wt 157.4 lb

## 2024-02-24 DIAGNOSIS — Z01419 Encounter for gynecological examination (general) (routine) without abnormal findings: Secondary | ICD-10-CM | POA: Diagnosis not present

## 2024-02-24 DIAGNOSIS — Z124 Encounter for screening for malignant neoplasm of cervix: Secondary | ICD-10-CM | POA: Diagnosis present

## 2024-02-24 DIAGNOSIS — R102 Pelvic and perineal pain: Secondary | ICD-10-CM | POA: Diagnosis not present

## 2024-02-24 DIAGNOSIS — Z113 Encounter for screening for infections with a predominantly sexual mode of transmission: Secondary | ICD-10-CM

## 2024-02-24 DIAGNOSIS — G8929 Other chronic pain: Secondary | ICD-10-CM

## 2024-02-24 NOTE — Progress Notes (Signed)
 NEW GYNECOLOGY PATIENT Patient name: Lindsey Duran MRN [id number]  Date of birth: 1991-09-21 Chief Complaint:   Abdominal Pain     History:  Lindsey Duran is a 33 y.o. G0P0000 being seen today for abdominal pain. Pain stated in January. 10/10 at worst; intermittent pain. Pain last week was Friday - Tuesday, caused issues with ambulation, worse leading up to her menses. Menses are 3-4 days, monthly, moderate flow, LMP 5/2.  Pain is just on the right sided, sharp, stabbing pain.   Discussed the use of AI scribe software for clinical note transcription with the patient, who gave verbal consent to proceed.  History of Present Illness [name] is a 33 year old female who presents with chronic right-sided pelvic and groin pain.  She has been experiencing persistent, throbbing pain in the right-sided pelvic and groin area since late December or early January, occasionally radiating to the side and lower back. The pain intensifies a couple of days before her menstrual period but does not change significantly with bowel movements.  Initially, she sought medical attention in January due to constipation unresponsive to dietary changes or Miralax. A laxative provided temporary relief for bowel movements but did not alleviate the pain. She maintains a high fiber diet and has tried various remedies without significant improvement. Physical activities such as standing, walking, or cooking aggravate the pain, necessitating frequent breaks. She finds some relief using a heating pad.  The pain is not associated with changes in her menstrual cycle, which remains regular, nor with urinary symptoms. No pain during intercourse, and she has not engaged in sexual activity since the onset of her symptoms. No history of nausea, vomiting, or significant changes in menstrual flow. She has not had any prior abdominal surgeries and retains her appendix and gallbladder.  A previous ultrasound  revealed a small fibroid, but no other significant findings. She experiences a popping sensation in her hip during certain movements, which she associates with the pain. She has not had any chronic back pain issues, although she visited the emergency room for severe back pain in late January or February.  Her current medications include Miralax, used as directed by her primary care physician.      Gynecologic History Patient's last menstrual period was 02/21/2024 (exact date). Contraception: none Last Pap: unsure Last Mammogram: n/a Last Colonoscopy: n/a  Obstetric History OB History  Gravida Para Term Preterm AB Living  0 0 0 0 0 0  SAB IAB Ectopic Multiple Live Births  0 0 0 0 0    Past Medical History:  Diagnosis Date   Anemia    Asthma    Fibroid, uterine    Migraines     Past Surgical History:  Procedure Laterality Date   TONSILECTOMY, ADENOIDECTOMY, BILATERAL MYRINGOTOMY AND TUBES      Current Outpatient Medications on File Prior to Visit  Medication Sig Dispense Refill   albuterol  (VENTOLIN  HFA) 108 (90 Base) MCG/ACT inhaler Inhale 2 puffs into the lungs every 6 (six) hours as needed for wheezing or shortness of breath (Cough). 18 g 1   fluticasone  (FLONASE ) 50 MCG/ACT nasal spray Place 1 spray into both nostrils daily. Begin by using 2 sprays in each nare daily for 3 to 5 days, then decrease to 1 spray in each nare daily. 32 mL 1   fexofenadine  (ALLEGRA ) 180 MG tablet Take 1 tablet (180 mg total) by mouth daily. 90 tablet 1   montelukast  (SINGULAIR ) 10 MG tablet Take 1  tablet (10 mg total) by mouth at bedtime. (Patient not taking: Reported on 02/06/2024) 30 tablet 2   No current facility-administered medications on file prior to visit.    No Known Allergies  Social History:  reports that she has quit smoking. Her smoking use included cigarettes. She has a 1.3 pack-year smoking history. She has never used smokeless tobacco. She reports current alcohol use. She  reports that she does not currently use drugs after having used the following drugs: Marijuana. Frequency: 1.00 time per week.  Family History  Problem Relation Age of Onset   Hypertension Paternal Grandmother    Diabetes gravidarum Other    High blood pressure Other    Colon cancer Neg Hx    Esophageal cancer Neg Hx    Pancreatic cancer Neg Hx    Colon polyps Neg Hx     The following portions of the patient's history were reviewed and updated as appropriate: allergies, current medications, past family history, past medical history, past social history, past surgical history and problem list.  Review of Systems Pertinent items noted in HPI and remainder of comprehensive ROS otherwise negative.  Physical Exam:  BP 112/71   Pulse 64   Wt 157 lb 6.4 oz (71.4 kg)   LMP 02/21/2024 (Exact Date)   BMI 23.93 kg/m  Physical Exam Vitals and nursing note reviewed. Exam conducted with a chaperone present.  Constitutional:      Appearance: Normal appearance.  Pulmonary:     Effort: Pulmonary effort is normal.  Abdominal:     Palpations: Abdomen is soft.  Genitourinary:    General: Normal vulva.     Exam position: Lithotomy position.     Comments: Normal appearing vulva Normal vulvar sensation bilaterally Nontender superficial pelvic floor muscles Nontender ischial tuberosities bilaterally  Allodynia at introitus: No  Anal wink present Posterior vaginal wall nontender Right levator ani 4/10 Right ischiococcygeous 1/10 Right obturator internus 4/10 Left levator ani 1/10 Left ischioccocygeous 1/10 Left obturator internus 1/10 Deep levator tender tenderness   Neurological:     Mental Status: She is alert.        Assessment and Plan:   Assessment & Plan Chronic pelvic pain Chronic pelvic pain with unclear etiology, possibly hip-related due to positional nature and popping sound. Endometriosis unlikely. Pelvic floor tenderness noted but not consistent with pain. -  Consider specialist referral for hip evaluation if symptoms persist. - Consider further diagnostic testing if pain does not resolve.  Constipation Constipation unrelieved by dietary changes or Miralax, with temporary relief from laxatives. - Continue high fiber diet. - Consider further evaluation if constipation persists.  Well Woman - Cervical cancer screening: Discussed guidelines. Pap with HPV collected - GC/CT: accepts - Breast Health: Encouraged self breast awareness/SBE. Teaching provided.  - F/U 12 months and prn   Routine preventative health maintenance measures emphasized. Please refer to After Visit Summary for other counseling recommendations.   Follow-up: No follow-ups on file.      Kiki Pelton, MD Obstetrician & Gynecologist, Faculty Practice [facility] [employer], [facility]

## 2024-02-25 ENCOUNTER — Encounter: Payer: Self-pay | Admitting: Gastroenterology

## 2024-02-26 ENCOUNTER — Encounter: Payer: Self-pay | Admitting: Obstetrics and Gynecology

## 2024-02-26 LAB — CYTOLOGY - PAP
Adequacy: ABSENT
Chlamydia: NEGATIVE
Comment: NEGATIVE
Comment: NEGATIVE
Comment: NEGATIVE
Comment: NORMAL
Diagnosis: NEGATIVE
High risk HPV: NEGATIVE
Neisseria Gonorrhea: NEGATIVE
Trichomonas: NEGATIVE

## 2024-02-26 LAB — RPR+HBSAG+HCVAB+...
HIV Screen 4th Generation wRfx: NONREACTIVE
Hep C Virus Ab: NONREACTIVE
Hepatitis B Surface Ag: NEGATIVE
RPR Ser Ql: NONREACTIVE

## 2024-03-02 ENCOUNTER — Encounter: Payer: Self-pay | Admitting: Gastroenterology

## 2024-03-02 ENCOUNTER — Ambulatory Visit (AMBULATORY_SURGERY_CENTER): Admitting: Gastroenterology

## 2024-03-02 VITALS — BP 111/65 | HR 60 | Resp 34

## 2024-03-02 DIAGNOSIS — R1031 Right lower quadrant pain: Secondary | ICD-10-CM

## 2024-03-02 DIAGNOSIS — G8929 Other chronic pain: Secondary | ICD-10-CM

## 2024-03-02 DIAGNOSIS — K5900 Constipation, unspecified: Secondary | ICD-10-CM | POA: Diagnosis present

## 2024-03-02 MED ORDER — SODIUM CHLORIDE 0.9 % IV SOLN: 500.0000 mL | INTRAVENOUS | Status: DC

## 2024-03-02 NOTE — Patient Instructions (Signed)
 Resume previous diet and medications. Repeat Colonoscopy at age 33 for screening purposes. Continue Linzess 145 mcg po daily Follow up in the office as needed for ongoing management of Chronic GI symptoms.  YOU HAD AN ENDOSCOPIC PROCEDURE TODAY AT THE [facility]:   Refer to the procedure report that was given to you for any specific questions about what was found during the examination.  If the procedure report does not answer your questions, please call your gastroenterologist to clarify.  If you requested that your care partner not be given the details of your procedure findings, then the procedure report has been included in a sealed envelope for you to review at your convenience later.  YOU SHOULD EXPECT: Some feelings of bloating in the abdomen. Passage of more gas than usual.  Walking can help get rid of the air that was put into your GI tract during the procedure and reduce the bloating. If you had a lower endoscopy (such as a colonoscopy or flexible sigmoidoscopy) you may notice spotting of blood in your stool or on the toilet paper. If you underwent a bowel prep for your procedure, you may not have a normal bowel movement for a few days.  Please Note:  You might notice some irritation and congestion in your nose or some drainage.  This is from the oxygen used during your procedure.  There is no need for concern and it should clear up in a day or so.  SYMPTOMS TO REPORT IMMEDIATELY:  Following lower endoscopy (colonoscopy or flexible sigmoidoscopy):  Excessive amounts of blood in the stool  Significant tenderness or worsening of abdominal pains  Swelling of the abdomen that is new, acute  Fever of 100F or higher  Following upper endoscopy (EGD)  Vomiting of blood or coffee ground material  New chest pain or pain under the shoulder blades  Painful or persistently difficult swallowing  New shortness of breath  Fever of 100F or higher  Black, tarry-looking stools  For  urgent or emergent issues, a gastroenterologist can be reached at any hour by calling [phone]. Do not use MyChart messaging for urgent concerns.    DIET:  We do recommend a small meal at first, but then you may proceed to your regular diet.  Drink plenty of fluids but you should avoid alcoholic beverages for 24 hours.  ACTIVITY:  You should plan to take it easy for the rest of today and you should NOT DRIVE or use heavy machinery until tomorrow (because of the sedation medicines used during the test).    FOLLOW UP: Our staff will call the number listed on your records the next business day following your procedure.  We will call around 7:15- 8:00 am to check on you and address any questions or concerns that you may have regarding the information given to you following your procedure. If we do not reach you, we will leave a message.     If any biopsies were taken you will be contacted by phone or by letter within the next 1-3 weeks.  Please call us  at [phone] if you have not heard about the biopsies in 3 weeks.    SIGNATURES/CONFIDENTIALITY: You and/or your care partner have signed paperwork which will be entered into your electronic medical record.  These signatures attest to the fact that that the information above on your After Visit Summary has been reviewed and is understood.  Full responsibility of the confidentiality of this discharge information lies with you  and/or your care-partner.

## 2024-03-02 NOTE — Progress Notes (Signed)
 Pt resting comfortably. VSS. Airway intact. SBAR complete to RN. All questions answered.

## 2024-03-02 NOTE — Progress Notes (Signed)
 History and Physical Interval Note:  03/02/2024 2:48 PM  Lindsey Duran  has presented today for endoscopic procedure(s), with the diagnosis of  Encounter Diagnosis  Name Primary?   Constipation, unspecified constipation type Yes  .  The various methods of evaluation and treatment have been discussed with the patient and/or family. After consideration of risks, benefits and other options for treatment, the patient has consented to  the endoscopic procedure(s).   The patient's history has been reviewed, patient examined, no change in status, stable for endoscopic procedure(s).  I have reviewed the patient's chart and labs.  Questions were answered to the patient's satisfaction.     Freedom Lopezperez E. Cherryl Corona, MD [facility]

## 2024-03-02 NOTE — Op Note (Signed)
 [facility] Patient Name: Lindsey Duran Procedure Date: 03/02/2024 2:39 PM MRN: [id number] Endoscopist: Geralyn Knee E. Cherryl Corona , MD, [id number] Age: 33 Referring MD:  Date of Birth: 15-Dec-1990 Gender: Female Account #: [id number] Procedure:                Colonoscopy Indications:              Abdominal pain in the right lower quadrant Medicines:                Monitored Anesthesia Care Procedure:                Pre-Anesthesia Assessment:                           - Prior to the procedure, a History and Physical                            was performed, and patient medications and                            allergies were reviewed. The patient's tolerance of                            previous anesthesia was also reviewed. The risks                            and benefits of the procedure and the sedation                            options and risks were discussed with the patient.                            All questions were answered, and informed consent                            was obtained. Prior Anticoagulants: The patient has                            taken no anticoagulant or antiplatelet agents. ASA                            Grade Assessment: II - A patient with mild systemic                            disease. After reviewing the risks and benefits,                            the patient was deemed in satisfactory condition to                            undergo the procedure.                           After obtaining informed consent, the colonoscope  was passed under direct vision. Throughout the                            procedure, the patient's blood pressure, pulse, and                            oxygen saturations were monitored continuously. The                            Olympus Scope SN: [id number] was introduced through                            the anus and advanced to the the terminal ileum,                            with  identification of the appendiceal orifice and                            IC valve. The colonoscopy was performed without                            difficulty. The patient tolerated the procedure                            well. The quality of the bowel preparation was                            good. The terminal ileum, ileocecal valve,                            appendiceal orifice, and rectum were photographed.                            The bowel preparation used was SUPREP via split                            dose instruction. Scope In: 3:00:41 PM Scope Out: 3:10:35 PM Scope Withdrawal Time: 0 hours 6 minutes 8 seconds  Total Procedure Duration: 0 hours 9 minutes 54 seconds  Findings:                 The perianal and digital rectal examinations were                            normal. Pertinent negatives include normal                            sphincter tone and no palpable rectal lesions.                           The colon (entire examined portion) appeared normal.                           The terminal ileum appeared normal.  The retroflexed view of the distal rectum and anal                            verge was normal and showed no anal or rectal                            abnormalities. Complications:            No immediate complications. Estimated Blood Loss:     Estimated blood loss: none. Impression:               - The entire examined colon is normal.                           - The examined portion of the ileum was normal.                           - The distal rectum and anal verge are normal on                            retroflexion view.                           - No specimens collected.                           - No abnormalities to explain RLQ pain. History                            most consistent with IBS-C. Recommendation:           - Patient has a contact number available for                            emergencies. The signs and  symptoms of potential                            delayed complications were discussed with the                            patient. Return to normal activities tomorrow.                            Written discharge instructions were provided to the                            patient.                           - Resume previous diet.                           - Continue present medications.                           - Repeat colonoscopy at age 38 for screening  purposes.                           - Continue Linzess 145 mcg PO daily                           - Follow up as needed in the office for ongoing                            management of chronic GI symptoms. Analys Ryden E. Cherryl Corona, MD 03/02/2024 3:17:29 PM This report has been signed electronically.

## 2024-03-03 ENCOUNTER — Telehealth: Payer: Self-pay | Admitting: *Deleted

## 2024-03-03 NOTE — Telephone Encounter (Signed)
 Post procedure follow up call placed, no answer and left VM.

## 2024-03-19 ENCOUNTER — Ambulatory Visit: Admitting: Physician Assistant
